# Patient Record
Sex: Male | Born: 1960 | Race: Black or African American | Hispanic: No | Marital: Married | Smoking: Current every day smoker
Health system: Southern US, Community
[De-identification: ages and names within clinical notes are randomized; demographics above are authoritative.]

## PROBLEM LIST (undated history)

## (undated) DIAGNOSIS — I480 Paroxysmal atrial fibrillation: Secondary | ICD-10-CM

## (undated) DIAGNOSIS — Z8489 Family history of other specified conditions: Secondary | ICD-10-CM

## (undated) DIAGNOSIS — E119 Type 2 diabetes mellitus without complications: Secondary | ICD-10-CM

## (undated) DIAGNOSIS — I10 Essential (primary) hypertension: Secondary | ICD-10-CM

## (undated) DIAGNOSIS — K859 Acute pancreatitis without necrosis or infection, unspecified: Secondary | ICD-10-CM

## (undated) DIAGNOSIS — R112 Nausea with vomiting, unspecified: Secondary | ICD-10-CM

## (undated) HISTORY — PX: NO PAST SURGERIES: SHX2092

---

## 1998-05-04 ENCOUNTER — Emergency Department (HOSPITAL_COMMUNITY): Admission: EM | Admit: 1998-05-04 | Discharge: 1998-05-04 | Payer: Self-pay | Admitting: Emergency Medicine

## 1998-10-02 ENCOUNTER — Emergency Department (HOSPITAL_COMMUNITY): Admission: EM | Admit: 1998-10-02 | Discharge: 1998-10-02 | Payer: Self-pay | Admitting: Emergency Medicine

## 1998-11-05 ENCOUNTER — Encounter: Admission: RE | Admit: 1998-11-05 | Discharge: 1999-02-03 | Payer: Self-pay | Admitting: Unknown Physician Specialty

## 2008-06-27 ENCOUNTER — Emergency Department (HOSPITAL_COMMUNITY): Admission: EM | Admit: 2008-06-27 | Discharge: 2008-06-27 | Payer: Self-pay | Admitting: Emergency Medicine

## 2008-07-05 ENCOUNTER — Emergency Department (HOSPITAL_COMMUNITY): Admission: EM | Admit: 2008-07-05 | Discharge: 2008-07-05 | Payer: Self-pay | Admitting: Emergency Medicine

## 2008-11-10 ENCOUNTER — Emergency Department (HOSPITAL_COMMUNITY): Admission: EM | Admit: 2008-11-10 | Discharge: 2008-11-10 | Payer: Self-pay | Admitting: Emergency Medicine

## 2008-11-28 ENCOUNTER — Emergency Department (HOSPITAL_COMMUNITY): Admission: EM | Admit: 2008-11-28 | Discharge: 2008-11-28 | Payer: Self-pay | Admitting: Emergency Medicine

## 2008-11-30 ENCOUNTER — Emergency Department (HOSPITAL_COMMUNITY): Admission: EM | Admit: 2008-11-30 | Discharge: 2008-11-30 | Payer: Self-pay | Admitting: Emergency Medicine

## 2009-09-03 ENCOUNTER — Emergency Department (HOSPITAL_COMMUNITY): Admission: EM | Admit: 2009-09-03 | Discharge: 2009-09-03 | Payer: Self-pay | Admitting: Emergency Medicine

## 2010-08-24 LAB — GLUCOSE, CAPILLARY

## 2010-09-05 ENCOUNTER — Emergency Department (HOSPITAL_COMMUNITY): Payer: 59

## 2010-09-05 ENCOUNTER — Inpatient Hospital Stay (HOSPITAL_COMMUNITY)
Admission: EM | Admit: 2010-09-05 | Discharge: 2010-09-07 | DRG: 310 | Disposition: A | Discharge status: Home / Self Care | Payer: 59 | Attending: Internal Medicine | Admitting: Internal Medicine

## 2010-09-05 DIAGNOSIS — R109 Unspecified abdominal pain: Secondary | ICD-10-CM | POA: Diagnosis present

## 2010-09-05 DIAGNOSIS — K219 Gastro-esophageal reflux disease without esophagitis: Secondary | ICD-10-CM | POA: Diagnosis present

## 2010-09-05 DIAGNOSIS — Z7982 Long term (current) use of aspirin: Secondary | ICD-10-CM

## 2010-09-05 DIAGNOSIS — I10 Essential (primary) hypertension: Secondary | ICD-10-CM | POA: Diagnosis present

## 2010-09-05 DIAGNOSIS — E119 Type 2 diabetes mellitus without complications: Secondary | ICD-10-CM | POA: Diagnosis present

## 2010-09-05 DIAGNOSIS — F17200 Nicotine dependence, unspecified, uncomplicated: Secondary | ICD-10-CM | POA: Diagnosis present

## 2010-09-05 DIAGNOSIS — I4891 Unspecified atrial fibrillation: Principal | ICD-10-CM | POA: Diagnosis present

## 2010-09-05 LAB — GLUCOSE, CAPILLARY
Glucose-Capillary: 326 mg/dL — ABNORMAL HIGH (ref 70–99)
Glucose-Capillary: 214 mg/dL — ABNORMAL HIGH (ref 70–99)

## 2010-09-05 LAB — COMPREHENSIVE METABOLIC PANEL
AST: 28 U/L (ref 0–37)
Albumin: 4.1 g/dL (ref 3.5–5.2)
Calcium: 9.5 mg/dL (ref 8.4–10.5)
Chloride: 97 mEq/L (ref 96–112)
Creatinine, Ser: 1.15 mg/dL (ref 0.4–1.5)
GFR calc Af Amer: >60 mL/min (ref >60)
Total Bilirubin: 0.6 mg/dL (ref 0.3–1.2)
Total Protein: 7.2 g/dL (ref 6.0–8.3)

## 2010-09-05 LAB — CBC
Hemoglobin: 13.4 g/dL (ref 13.0–17.0)
MCH: 24.3 pg — ABNORMAL LOW (ref 26.0–34.0)
RBC: 5.52 MIL/uL (ref 4.22–5.81)
WBC: 7.2 10*3/uL (ref 4.0–10.5)

## 2010-09-05 LAB — URINALYSIS, ROUTINE W REFLEX MICROSCOPIC
Glucose, UA: >1000 mg/dL — AB
Hgb urine dipstick: NEGATIVE
Leukocytes, UA: NEGATIVE
Specific Gravity, Urine: 1.029 (ref 1.005–1.030)
pH: 5.5 (ref 5.0–8.0)

## 2010-09-05 LAB — DIFFERENTIAL
Basophils Relative: 0 % (ref 0–1)
Lymphs Abs: 1.3 10*3/uL (ref 0.7–4.0)
Monocytes Relative: 4 % (ref 3–12)
Neutro Abs: 5.6 10*3/uL (ref 1.7–7.7)
Neutrophils Relative %: 77 % (ref 43–77)

## 2010-09-05 LAB — CARDIAC PANEL(CRET KIN+CKTOT+MB+TROPI)
Total CK: 357 U/L — ABNORMAL HIGH (ref 7–232)
Troponin I: 0.01 ng/mL (ref 0.00–0.06)

## 2010-09-05 LAB — POCT CARDIAC MARKERS
CKMB, poc: 1.1 ng/mL (ref 1.0–8.0)
Troponin i, poc: <0.05 ng/mL (ref 0.00–0.09)

## 2010-09-05 LAB — URINE MICROSCOPIC-ADD ON

## 2010-09-05 LAB — CK TOTAL AND CKMB (NOT AT ARMC): Total CK: 362 U/L — ABNORMAL HIGH (ref 7–232)

## 2010-09-05 NOTE — H&P (Addendum)
[name], [name]             ACCOUNT NO.:  [id number]  MEDICAL RECORD NO.:  [id number]           PATIENT TYPE:  E  LOCATION:  [facility]                         FACILITY:  [facility]  PHYSICIAN:  Andreas Blower, MD       DATE OF BIRTH:  02/08/1961  DATE OF ADMISSION:  09/05/2010 DATE OF DISCHARGE:                             HISTORY & PHYSICAL   PRIMARY CARE PROVIDER:  Margaretmary Bayley, MD  The patient is being admitted to triad hospitalist with [facility], Team #2.  CHIEF COMPLAINT:  Abdominal pain.  HISTORY OF PRESENT ILLNESS:  [name] [name] is very pleasant 50 year old male with a history of diabetes who presents to the [facility] ED with a chief complaint of burning epigastric discomfort for the past week. Information is obtained from the patient and his wife who is at the bedside.  He reports that he has also experienced intermittent nausea and vomiting without bloody emesis or coffee-ground emesis.  He denies diarrhea, shortness of breath, or chest pain.  He states that this has been going on for 7 days and for the last 3 days, he was doing fairly well until this morning, had awakened him about 2 a.m.  He did report vomiting this morning.  He also reports and gastric reflux during this time.  He says that he took Zantac over-the-counter and got some mild relief.  He indicates that he had a sensation of "food being stuck in the top of my stomach and I needed to burp."  He also indicates that he got some relief after vomiting.  He denies any radiation of the pain to his arm, shoulder, or jaw.  He does endorse some chills.  During his workup in the emergency room, he went into AFib.  Symptoms came on gradually, have persistent intermittently are characterized as mild to moderate.  We are asked to admit for further evaluation and treatment.  ALLERGIES:  No known drug allergies.  PAST MEDICAL HISTORY:  Diabetes type 2.  PAST SURGICAL HISTORY:  None.  FAMILY MEDICAL HISTORY:  Positive for  diabetes with his grandmother and his mother.  Father deceased and he is unaware of his medical history.  MEDICATIONS: 1. Glucovance 550 p.o. b.i.d. 2. Lisinopril, dose unknown. 3. Triginta dose unknown, to be verified by pharmacy.  REVIEW OF SYSTEMS:  GENERAL:  Positive chills.  Negative fever. Unintentional weight loss.  ENT:  Negative for ear pain, nasal congestion, sore throat.  CARDIOVASCULAR:  Negative for chest pain, palpitation, and  lower extremity edema.  RESPIRATORY:  Negative shortness of breath or cough.  MUSCULOSKELETAL:  Negative for joint pain and muscle weakness.  NEUROLOGIC:  Negative visual disturbances, numbness, tingling of extremities, headache.  GASTROINTESTINAL:  See HPI.  GENITOURINARY:  Negative for dysuria, hematuria, frequency, or urgency.  PSYCHIATRIC:  Negative for depression and anxiety. HEMATOLOGIC:  Negative for any unusual bruising or bleeding.  LABORATORY DATA: 1. Sodium 131, potassium 4.6, chloride 97, CO2 of 23, BUN 18,     creatinine 1.15, and glucose 342.  WBC 7.2, hemoglobin 13.4,     hematocrit 40.1, platelets are 209, MCV 72.6.  Lipase 67, CK-MB  1.1, troponin 1 less than 0.05, myoglobin 66.9. 2. Urinalysis 15 ketones greater than 1000 urine, rare bacteria, 0-2     wbcs.  PHYSICAL EXAMINATION:  VITAL SIGNS:  T 97.5, BP 121/79, heart rate 90, respirations 19, and sats 97% on room air. GENERAL:  Awake, alert, well-nourished, well-hydrated no acute distress. HEAD:  Normocephalic, atraumatic.  Pupils are equal round and reactive to light.  EOMI.  Mucous membranes of mouth are moist and pink.  No obvious lesion or exudate in his nose or ears. NECK:  Supple.  No JVD.  Full range of motion.  No lymphadenopathy. CARDIOVASCULAR:  Regular rate and rhythm.  No murmur, gallop, or rub. No lower extremity edema. ABDOMEN:  Flat, soft.  Positive bowel sounds, but sluggish, nontender to palpation. NEUROLOGIC:  Alert and oriented x3.  Speech clear.   Facial symmetry. Cranial nerves II through XII grossly intact. MUSCULOSKELETAL:  Moves all extremities.  No joint swelling/erythema. EXTREMITIES:  Without clubbing or cyanosis.  ASSESSMENT/PLAN: 1. Atrial fibrillation with rapid ventricular rate.  Now controlled     and back in sinus rythm without intervention.  We will admit to     tele floor on low-dose beta-blocker.  We will get a 2-D echo.     His CHADS-2 score if no hypertension is one, for which aspirin     will be started.  The patient denies history of hypertension.     States he is on an ACE for nephroprotection.  If after his     admission he continues with intermittent atrial fibrillation,     we will consider Coumadin.  If he stays in sinus rhythm with a     beta-blocker only, we may consider discharge on aspirin with     further management by his PCP. 2. Gastric pain, uncertain if mild pancreatitis as his lipase is     elevated.  Also endorses some EtOH use.  We will place on a clear     liquid, advance as tolerated.  We will get abdominal x-ray.  If no     relief, consider right upper quadrant ultrasound. 3. Diabetes.  Continue his home meds once there are known. Sliding     scale glycemic control. 4. Hypertension.  May need to decrease his antihypertensives given     that beta-blocker was started during this hospitalization.  We will     monitor. 5. Tobacco use.  We will provide nicotine patch as needed. 6. EtOH use.  Some question about the degree of use.  CIWA protocol as     there is a doubt that he may be consuming more than he reported. 7. DVT prophylaxis.  We will use Lovenox. 8. Code status.  The patient is full code.  This assessment and plan was discussed with Dr. Betti Cruz.   Gwenyth Bender, NP ______________________________ Andreas Blower, MD   KMB/MEDQ  D:  09/05/2010  T:  09/05/2010  Job:  [id number]  cc:   Margaretmary Bayley, M.D.  Electronically Signed by Andreas Blower  on 09/05/2010 04:06:20 PM Electronically  Signed by Toya Smothers  on 09/12/2010 09:21:13 AM

## 2010-09-06 LAB — CBC
HCT: 36.4 % — ABNORMAL LOW (ref 39.0–52.0)
MCH: 23.6 pg — ABNORMAL LOW (ref 26.0–34.0)
MCV: 72.9 fL — ABNORMAL LOW (ref 78.0–100.0)
Platelets: 200 10*3/uL (ref 150–400)
RDW: 14.5 % (ref 11.5–15.5)
WBC: 6.9 10*3/uL (ref 4.0–10.5)

## 2010-09-06 LAB — GLUCOSE, CAPILLARY
Glucose-Capillary: 280 mg/dL — ABNORMAL HIGH (ref 70–99)
Glucose-Capillary: 208 mg/dL — ABNORMAL HIGH (ref 70–99)
Glucose-Capillary: 256 mg/dL — ABNORMAL HIGH (ref 70–99)

## 2010-09-06 LAB — BASIC METABOLIC PANEL
BUN: 16 mg/dL (ref 6–23)
Chloride: 99 mEq/L (ref 96–112)
Creatinine, Ser: 1.17 mg/dL (ref 0.4–1.5)
Glucose, Bld: 297 mg/dL — ABNORMAL HIGH (ref 70–99)
Potassium: 4.8 mEq/L (ref 3.5–5.1)

## 2010-09-06 LAB — HEMOGLOBIN A1C
Hgb A1c MFr Bld: 10.3 % — ABNORMAL HIGH (ref <5.7)
Mean Plasma Glucose: 249 mg/dL — ABNORMAL HIGH (ref <117)

## 2010-09-06 LAB — PROTIME-INR: INR: 0.98 (ref 0.00–1.49)

## 2010-09-06 LAB — MAGNESIUM: Magnesium: 1.7 mg/dL (ref 1.5–2.5)

## 2010-09-06 LAB — LIPID PANEL
Cholesterol: 159 mg/dL (ref 0–200)
LDL Cholesterol: 92 mg/dL (ref 0–99)

## 2010-09-06 LAB — CARDIAC PANEL(CRET KIN+CKTOT+MB+TROPI)
Relative Index: 0.5 (ref 0.0–2.5)
Troponin I: 0.01 ng/mL (ref 0.00–0.06)

## 2010-09-07 DIAGNOSIS — I4891 Unspecified atrial fibrillation: Secondary | ICD-10-CM

## 2010-09-07 LAB — CBC
MCH: 23.4 pg — ABNORMAL LOW (ref 26.0–34.0)
MCHC: 32.1 g/dL (ref 30.0–36.0)
MCV: 72.8 fL — ABNORMAL LOW (ref 78.0–100.0)
Platelets: 220 10*3/uL (ref 150–400)
RDW: 14.6 % (ref 11.5–15.5)

## 2010-09-07 LAB — BASIC METABOLIC PANEL
BUN: 15 mg/dL (ref 6–23)
CO2: 30 mEq/L (ref 19–32)
Calcium: 10 mg/dL (ref 8.4–10.5)
GFR calc non Af Amer: >60 mL/min (ref >60)
Glucose, Bld: 312 mg/dL — ABNORMAL HIGH (ref 70–99)

## 2010-09-07 LAB — GLUCOSE, CAPILLARY: Glucose-Capillary: 346 mg/dL — ABNORMAL HIGH (ref 70–99)

## 2010-09-08 NOTE — Discharge Summary (Signed)
NAMEJAMERION, [name] [name]             ACCOUNT NO.:  [id number]  MEDICAL RECORD NO.:  [id number]           PATIENT TYPE:  LOCATION:                                 FACILITY:  PHYSICIAN:  Andreas Blower, MD       DATE OF BIRTH:  1961/01/05  DATE OF ADMISSION: DATE OF DISCHARGE:                              DISCHARGE SUMMARY   PRIMARY CARE PHYSICIAN:  Margaretmary Bayley, MD.  DISCHARGE DIAGNOSES: 1. Atrial fibrillation with rapid ventricular response, resolved.     Currently in sinus rhythm at the time of discharge. 2. Hypertension. 3. Gastroesophageal reflux disease/abdominal pain, resolved. 4. Type 2 diabetes. 5. Tobacco use. 6. Alcohol use.  DISCHARGE MEDICATIONS: 1. Aspirin 325 mg p.o. daily. 2. Folic acid 1 mg p.o. daily. 3. Lisinopril 5 mg p.o. daily. 4. Metoprolol 25 mg p.o. twice daily. 5. Multivitamin 1 tablet p.o. daily. 6. Nicotine patch 14 mg per 24 hours daily as needed. 7. Omeprazole 20 mg p.o. daily. 8. Thiamine 100 mg p.o. daily. 9. Glyburide/metformin 5/500 1 tablet p.o. twice daily. 10.Linagliptin 5 mg p.o. daily. 11.Ranitidine 1 tablet p.o. twice daily as needed.  BRIEF ADMITTING HISTORY AND PHYSICAL:  [name] [name] is a 50 year old African American male, who presented to the ED with complaints of abdominal pain.  While he was in the ER, on monitor, he was found to have AFib with RVR, which resolved without any intervention.  RADIOLOGY/IMAGING:  Abdominal series showed benign appearing abdomen and chest.  CBC shows a white count of 5.4, hemoglobin 13.5, hematocrit 42.1, platelet count 220.  Electrolytes normal with a creatinine of 1.08. Liver function tests normal.  Hemoglobin A1c is 10.3, lipase is 67. Troponins are negative x3.  LDL is 92.  UA was negative for nitrates and leukocytes.  The patient had 2D-echocardiogram, which showed left ventricle, showed mild global hypokinesis, ejection fraction was 45%-50%, the cavity size was normal, wall thickness was  increased in the pattern of mild LVH.  HOSPITAL COURSE BY PROBLEM: 1. AFib with RVR.  The patient initially was found to have heart rate     of 120s while he was in the ER; however, the patient converted to     normal sinus rhythm without any intervention.  The patient was     started on low-dose metoprolol 25 mg p.o. twice daily.  He did not     have any further episodes of AFib with RVR during the course of the     hospital stay.  The patient's CHADS2 score is 2.  Given that the     patient did not have any further episodes of AFib with RVR, the     patient will be discharged home on aspirin 325 mg p.o. daily.  A 2-     D echocardiogram was obtained with results as indicated above.  The     patient had his troponins trended and was ruled out for acute     coronary syndrome.  The patient was instructed to have Dr. Chestine Spore     referred the patient to cardiologist as an outpatient given that     the patient's  ejection fraction was decreased mildly at 45%-50% and     there was mild global hypokinesis. 2. Hypertension.  Blood pressure stable during the course of hospital     stay.  He will be discharged home on metoprolol and lisinopril. 3. GERD/abdominal pain, resolved.  He was discharged home on     omeprazole 20 mg p.o. daily.  Initially, lipase was mildly     elevated, however, the patient did not have any difficulty eating     during the course of hospital stay. 4. Type 2 diabetes.  Hemoglobin A1c was 10.3.  I had a serious     discussion with the patient about the need for insulin.  He stated     that he will follow with Dr. Chestine Spore in about 3 months, at which     time, Dr. Chestine Spore can check another hemoglobin A1c.  If still greater     than 10, would consider starting him on insulin, and the patient     was agreeable for insulin to be started at that time. 5. Tobacco use, encouraged smoking cessation.  Nicotine patches was     prescribed at the time of discharge. 6. Alcohol abuse.   Again, encouraged to stop drinking.  Thiamine     and folate were prescribed at the time of discharge.  Initially on     admission, the patient was started on CIWA protocol, however, he     did not require Ativan during the course of hospital stay.  DISPOSITION AND FOLLOWUP:  The patient is to follow up with his primary care physician Dr. Chestine Spore in 1 week.  Dr. Chestine Spore is to refer the patient to a cardiologist as an outpatient.  Dr. Chestine Spore do also help him to continue to manage his diabetes as an outpatient.  TIME SPENT ON DISCHARGE:  Talking to the patient, talking to the patient's wife, and counseling the patient is 35 minutes.   Andreas Blower, MD   SR/MEDQ  D:  09/07/2010  T:  09/08/2010  Job:  [id number]  cc:   Margaretmary Bayley, M.D.  Electronically Signed by Wardell Heath Maurice Ramseur  on 09/08/2010 09:34:50 PM

## 2010-09-19 LAB — GLUCOSE, CAPILLARY: Glucose-Capillary: 314 mg/dL — ABNORMAL HIGH (ref 70–99)

## 2011-01-30 ENCOUNTER — Emergency Department (HOSPITAL_COMMUNITY)
Admission: EM | Admit: 2011-01-30 | Discharge: 2011-01-30 | Discharge status: Against Medical Advice | Payer: 59 | Attending: Emergency Medicine | Admitting: Emergency Medicine

## 2011-01-30 DIAGNOSIS — K089 Disorder of teeth and supporting structures, unspecified: Secondary | ICD-10-CM | POA: Insufficient documentation

## 2013-05-24 ENCOUNTER — Emergency Department (HOSPITAL_COMMUNITY)
Admission: EM | Admit: 2013-05-24 | Discharge: 2013-05-24 | Disposition: A | Discharge status: Home / Self Care | Payer: 59 | Attending: Emergency Medicine | Admitting: Emergency Medicine

## 2013-05-24 ENCOUNTER — Encounter (HOSPITAL_COMMUNITY): Payer: Self-pay | Admitting: Emergency Medicine

## 2013-05-24 DIAGNOSIS — S46909A Unspecified injury of unspecified muscle, fascia and tendon at shoulder and upper arm level, unspecified arm, initial encounter: Secondary | ICD-10-CM | POA: Insufficient documentation

## 2013-05-24 DIAGNOSIS — Y9389 Activity, other specified: Secondary | ICD-10-CM | POA: Insufficient documentation

## 2013-05-24 DIAGNOSIS — E119 Type 2 diabetes mellitus without complications: Secondary | ICD-10-CM | POA: Insufficient documentation

## 2013-05-24 DIAGNOSIS — S4980XA Other specified injuries of shoulder and upper arm, unspecified arm, initial encounter: Secondary | ICD-10-CM | POA: Insufficient documentation

## 2013-05-24 DIAGNOSIS — Y92410 Unspecified street and highway as the place of occurrence of the external cause: Secondary | ICD-10-CM | POA: Insufficient documentation

## 2013-05-24 DIAGNOSIS — IMO0002 Reserved for concepts with insufficient information to code with codable children: Secondary | ICD-10-CM | POA: Insufficient documentation

## 2013-05-24 DIAGNOSIS — F17200 Nicotine dependence, unspecified, uncomplicated: Secondary | ICD-10-CM | POA: Insufficient documentation

## 2013-05-24 DIAGNOSIS — M25511 Pain in right shoulder: Secondary | ICD-10-CM

## 2013-05-24 MED ORDER — MELOXICAM 7.5 MG PO TABS: 7.5000 mg | ORAL_TABLET | Freq: Every day | ORAL | Status: DC

## 2013-05-24 MED ORDER — METHOCARBAMOL 500 MG PO TABS: 500.0000 mg | ORAL_TABLET | Freq: Two times a day (BID) | ORAL | Status: DC
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
## 2013-05-28 NOTE — ED Provider Notes (Signed)
Medical screening examination/treatment/procedure(s) were performed by non-physician practitioner and as supervising physician I was immediately available for consultation/collaboration.  EKG Interpretation   None         Shona Pardo W. Alianis Trimmer, MD 05/28/13 1528 

## 2013-10-29 ENCOUNTER — Encounter (HOSPITAL_COMMUNITY): Payer: Self-pay | Admitting: Emergency Medicine

## 2013-10-29 ENCOUNTER — Emergency Department (HOSPITAL_COMMUNITY)
Admission: EM | Admit: 2013-10-29 | Discharge: 2013-10-30 | Disposition: A | Discharge status: Home / Self Care | Payer: 59 | Attending: Emergency Medicine | Admitting: Emergency Medicine

## 2013-10-29 DIAGNOSIS — S298XXA Other specified injuries of thorax, initial encounter: Secondary | ICD-10-CM | POA: Insufficient documentation

## 2013-10-29 DIAGNOSIS — F17200 Nicotine dependence, unspecified, uncomplicated: Secondary | ICD-10-CM | POA: Insufficient documentation

## 2013-10-29 DIAGNOSIS — Z79899 Other long term (current) drug therapy: Secondary | ICD-10-CM | POA: Insufficient documentation

## 2013-10-29 DIAGNOSIS — Y9301 Activity, walking, marching and hiking: Secondary | ICD-10-CM | POA: Insufficient documentation

## 2013-10-29 DIAGNOSIS — R55 Syncope and collapse: Secondary | ICD-10-CM | POA: Insufficient documentation

## 2013-10-29 DIAGNOSIS — S0180XA Unspecified open wound of other part of head, initial encounter: Secondary | ICD-10-CM | POA: Insufficient documentation

## 2013-10-29 DIAGNOSIS — Y92009 Unspecified place in unspecified non-institutional (private) residence as the place of occurrence of the external cause: Secondary | ICD-10-CM | POA: Insufficient documentation

## 2013-10-29 DIAGNOSIS — S0181XA Laceration without foreign body of other part of head, initial encounter: Secondary | ICD-10-CM

## 2013-10-29 DIAGNOSIS — E119 Type 2 diabetes mellitus without complications: Secondary | ICD-10-CM | POA: Insufficient documentation

## 2013-10-29 DIAGNOSIS — W1809XA Striking against other object with subsequent fall, initial encounter: Secondary | ICD-10-CM | POA: Insufficient documentation

## 2013-10-29 NOTE — ED Notes (Signed)
Brief confusion on what room to take pt to.  Pt was never placed in room 13.
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
## 2013-10-30 ENCOUNTER — Emergency Department (HOSPITAL_COMMUNITY): Payer: 59

## 2013-10-30 LAB — I-STAT CHEM 8, ED
BUN: 25 mg/dL — ABNORMAL HIGH (ref 6–23)
CALCIUM ION: 1.15 mmol/L (ref 1.12–1.23)
CHLORIDE: 94 meq/L — AB (ref 96–112)
Creatinine, Ser: 1.7 mg/dL — ABNORMAL HIGH (ref 0.50–1.35)
Glucose, Bld: 266 mg/dL — ABNORMAL HIGH (ref 70–99)
HEMATOCRIT: 39 % (ref 39.0–52.0)
Hemoglobin: 13.3 g/dL (ref 13.0–17.0)
Potassium: 3.9 mEq/L (ref 3.7–5.3)
Sodium: 139 mEq/L (ref 137–147)
TCO2: 25 mmol/L (ref 0–100)

## 2013-10-30 LAB — CBC WITH DIFFERENTIAL/PLATELET
BASOS ABS: 0 10*3/uL (ref 0.0–0.1)
BASOS PCT: 1 % (ref 0–1)
EOS PCT: 1 % (ref 0–5)
Eosinophils Absolute: 0.1 10*3/uL (ref 0.0–0.7)
HEMATOCRIT: 35.1 % — AB (ref 39.0–52.0)
Hemoglobin: 11.1 g/dL — ABNORMAL LOW (ref 13.0–17.0)
Lymphocytes Relative: 50 % — ABNORMAL HIGH (ref 12–46)
Lymphs Abs: 3.3 10*3/uL (ref 0.7–4.0)
MCH: 24.1 pg — ABNORMAL LOW (ref 26.0–34.0)
MCHC: 31.6 g/dL (ref 30.0–36.0)
MCV: 76.3 fL — ABNORMAL LOW (ref 78.0–100.0)
MONO ABS: 0.4 10*3/uL (ref 0.1–1.0)
Monocytes Relative: 6 % (ref 3–12)
NEUTROS ABS: 2.7 10*3/uL (ref 1.7–7.7)
Neutrophils Relative %: 42 % — ABNORMAL LOW (ref 43–77)
Platelets: 209 10*3/uL (ref 150–400)
RBC: 4.6 MIL/uL (ref 4.22–5.81)
RDW: 15 % (ref 11.5–15.5)
WBC: 6.4 10*3/uL (ref 4.0–10.5)

## 2013-10-30 LAB — I-STAT TROPONIN, ED: TROPONIN I, POC: 0 ng/mL (ref 0.00–0.08)

## 2013-10-30 LAB — ETHANOL: ALCOHOL ETHYL (B): 51 mg/dL — AB (ref 0–11)

## 2013-10-30 MED ORDER — TETANUS-DIPHTH-ACELL PERTUSSIS 5-2.5-18.5 LF-MCG/0.5 IM SUSP
0.5000 mL | Freq: Once | INTRAMUSCULAR | Status: DC
  Filled 2013-10-30: qty 0.5

## 2013-10-30 MED ORDER — SODIUM CHLORIDE 0.9 % IV BOLUS (SEPSIS)
1000.0000 mL | Freq: Once | INTRAVENOUS | Status: AC
  Administered 2013-10-30: 1000 mL via INTRAVENOUS

## 2013-10-30 NOTE — ED Provider Notes (Signed)
CSN: [id number]     Arrival date & time 10/29/13  2311 History   First MD Initiated Contact with Patient 10/30/13 0101     Chief Complaint  Patient presents with  . Syncope      (Consider location/radiation/quality/duration/timing/severity/associated sxs/prior Treatment) HPI  Past Medical History  Diagnosis Date  . Diabetes mellitus without complication    History reviewed. No pertinent past surgical history. No family history on file. History  Substance Use Topics  . Smoking status: Current Every Day Smoker    Types: Cigarettes  . Smokeless tobacco: Not on file  . Alcohol Use: Yes     Comment: occ    Review of Systems    Allergies  Bee venom  Home Medications   Prior to Admission medications   Medication Sig Start Date End Date Taking? Authorizing Provider  glyBURIDE-metformin (GLUCOVANCE) 5-500 MG per tablet Take 1 tablet by mouth 2 (two) times daily with a meal.   Yes Historical Provider, MD  linagliptin (TRADJENTA) 5 MG TABS tablet Take 5 mg by mouth daily.   Yes Historical Provider, MD  lisinopril (PRINIVIL,ZESTRIL) 5 MG tablet Take 5 mg by mouth daily.   Yes Historical Provider, MD  metoprolol tartrate (LOPRESSOR) 25 MG tablet Take 25 mg by mouth 2 (two) times daily.   Yes Historical Provider, MD   BP 136/72  Pulse 88  Temp(Src) 97.6 F (36.4 C) (Oral)  Resp 19  SpO2 100% Physical Exam  ED Course  LACERATION REPAIR Date/Time: 10/30/2013 3:10 AM Performed by: Arman FilterSCHULZ, Delois Tolbert K Authorized by: Arman FilterSCHULZ, Johnpaul Gillentine K Consent: Verbal consent obtained. written consent not obtained. Risks and benefits: risks, benefits and alternatives were discussed Consent given by: patient Patient understanding: patient states understanding of the procedure being performed Patient identity confirmed: verbally with patient Time out: Immediately prior to procedure a "time out" was called to verify the correct patient, procedure, equipment, support staff and site/side marked as  required. Body area: head/neck Location details: forehead Laceration length: 3 cm Foreign bodies: no foreign bodies Tendon involvement: none Nerve involvement: none Vascular damage: no Anesthesia: local infiltration Local anesthetic: lidocaine 1% with epinephrine Anesthetic total: 2 ml Patient sedated: no Preparation: Patient was prepped and draped in the usual sterile fashion. Irrigation solution: saline Amount of cleaning: standard Debridement: none Degree of undermining: none Skin closure: 6-0 Prolene Subcutaneous closure: 5-0 Vicryl Number of sutures: 10 Approximation: close Approximation difficulty: simple Dressing: antibiotic ointment Patient tolerance: Patient tolerated the procedure well with no immediate complications.   (including critical care time) Labs Review Labs Reviewed  ETHANOL - Abnormal; Notable for the following:    Alcohol, Ethyl (B) 51 (*)    All other components within normal limits  CBC WITH DIFFERENTIAL - Abnormal; Notable for the following:    Hemoglobin 11.1 (*)    HCT 35.1 (*)    MCV 76.3 (*)    MCH 24.1 (*)    Neutrophils Relative % 42 (*)    Lymphocytes Relative 50 (*)    All other components within normal limits  I-STAT CHEM 8, ED - Abnormal; Notable for the following:    Chloride 94 (*)    BUN 25 (*)    Creatinine, Ser 1.70 (*)    Glucose, Bld 266 (*)    All other components within normal limits  I-STAT TROPOININ, ED    Imaging Review Ct Head Wo Contrast  10/30/2013   CLINICAL DATA:  Status post fall; hit head against pavement. Loss of consciousness. Laceration at the  left forehead. Hypotension.  EXAM: CT HEAD WITHOUT CONTRAST  TECHNIQUE: Contiguous axial images were obtained from the base of the skull through the vertex without intravenous contrast.  COMPARISON:  None.  FINDINGS: There is no evidence of acute infarction, mass lesion, or intra- or extra-axial hemorrhage on CT.  The posterior fossa, including the cerebellum, brainstem  and fourth ventricle, is within normal limits. The third and lateral ventricles, and basal ganglia are unremarkable in appearance. The cerebral hemispheres are symmetric in appearance, with normal gray-white differentiation. No mass effect or midline shift is seen.  There is no evidence of fracture; visualized osseous structures are unremarkable in appearance. The orbits are within normal limits. Mild mucosal thickening is noted within the maxillary sinuses, left side of the sphenoid sinus and frontal sinuses; the remaining paranasal sinuses and mastoid air cells are well-aerated. A soft tissue laceration is noted overlying the left frontal calvarium, with associated soft tissue swelling on the left side.  IMPRESSION: 1. No evidence of traumatic intracranial injury or fracture. 2. Soft tissue laceration overlying the left frontal calvarium, with associated more diffuse soft tissue swelling. 3. Mild mucosal thickening within the maxillary sinuses, left side of the sphenoid sinus and frontal sinuses.   Electronically Signed   By: Roanna Raider M.D.   On: 10/30/2013 02:15     EKG Interpretation   Date/Time:  Thursday Oct 30 2013 01:29:21 EDT Ventricular Rate:  78 PR Interval:  193 QRS Duration: 77 QT Interval:  361 QTC Calculation: 411 R Axis:   51 Text Interpretation:  Sinus rhythm ST elev, probable normal early repol  pattern Previously atrial fibrillation Confirmed by Read Drivers  MD, Jonny Ruiz  [phone]) on 10/30/2013 2:20:27 AM      MDM   Final diagnoses:  Syncope  Laceration of forehead         Arman Filter, NP 10/30/13 [phone]

## 2013-10-30 NOTE — ED Provider Notes (Signed)
Medical screening examination/treatment/procedure(s) were conducted as a shared visit with non-physician practitioner(s) and myself.  I personally evaluated the patient during the encounter.   EKG Interpretation   Date/Time:  Thursday Oct 30 2013 01:29:21 EDT Ventricular Rate:  78 PR Interval:  193 QRS Duration: 77 QT Interval:  361 QTC Calculation: 411 R Axis:   51 Text Interpretation:  Sinus rhythm ST elev, probable normal early repol  pattern Previously atrial fibrillation Confirmed by Read Drivers  MD, Jonny Ruiz  [phone]) on 10/30/2013 2:20:27 AM        Hanley Seamen, MD 10/30/13 [phone]

## 2013-10-30 NOTE — ED Provider Notes (Signed)
CSN: [id number]     Arrival date & time 10/29/13  2311 History   First MD Initiated Contact with Patient 10/30/13 0101     Chief Complaint  Patient presents with  . Syncope      (Consider location/radiation/quality/duration/timing/severity/associated sxs/prior Treatment) HPI This is a 53 year old male who was walking home from a neighbors house just prior to arrival. He states he had "one and a half beers" (but admitted to his nurse that he had been drinking vodka earlier). As he was walking a neighbor observed him starting to stagger and then he passed out. He struck his forehead and has a laceration above the left eyebrow. He states he also has some mild tenderness to the left ribs. He denies neck pain. He was noted to be hypotensive on arrival but this is improved.  Past Medical History  Diagnosis Date  . Diabetes mellitus without complication    History reviewed. No pertinent past surgical history. No family history on file. History  Substance Use Topics  . Smoking status: Current Every Day Smoker    Types: Cigarettes  . Smokeless tobacco: Not on file  . Alcohol Use: Yes     Comment: occ    Review of Systems  All other systems reviewed and are negative.   Allergies  Bee venom  Home Medications   Prior to Admission medications   Medication Sig Start Date End Date Taking? Authorizing Provider  glyBURIDE-metformin (GLUCOVANCE) 5-500 MG per tablet Take 1 tablet by mouth 2 (two) times daily with a meal.   Yes Historical Provider, MD  linagliptin (TRADJENTA) 5 MG TABS tablet Take 5 mg by mouth daily.   Yes Historical Provider, MD  lisinopril (PRINIVIL,ZESTRIL) 5 MG tablet Take 5 mg by mouth daily.   Yes Historical Provider, MD  metoprolol tartrate (LOPRESSOR) 25 MG tablet Take 25 mg by mouth 2 (two) times daily.   Yes Historical Provider, MD   BP 128/65  Pulse 91  Temp(Src) 97.6 F (36.4 C) (Oral)  Resp 19  SpO2 100%  Physical Exam General: Well-developed,  well-nourished male in no acute distress; appearance consistent with age of record HENT: normocephalic; irregular laceration above left eyebrow; no hemotympanum Eyes: pupils equal, round and reactive to light; extraocular muscles intact Neck: supple; nontender Heart: regular rate and rhythm Lungs: clear to auscultation bilaterally Chest: Mild left chest wall tenderness without deformity or crepitus Abdomen: soft; nondistended; nontender Extremities: No deformity; full range of motion; pulses normal Neurologic: Awake, alert and oriented; motor function intact in all extremities and symmetric; no facial droop Skin: Warm and dry Psychiatric: Flat affect    ED Course  Procedures (including critical care time)   MDM   Nursing notes and vitals signs, including pulse oximetry, reviewed.  Summary of this visit's results, reviewed by myself:  Labs:  Results for orders placed during the hospital encounter of 10/29/13 (from the past 24 hour(s))  ETHANOL     Status: Abnormal   Collection Time    10/29/13 11:30 PM      Result Value Ref Range   Alcohol, Ethyl (B) 51 (*) 0 - 11 mg/dL  CBC WITH DIFFERENTIAL     Status: Abnormal   Collection Time    10/29/13 11:30 PM      Result Value Ref Range   WBC 6.4  4.0 - 10.5 K/uL   RBC 4.60  4.22 - 5.81 MIL/uL   Hemoglobin 11.1 (*) 13.0 - 17.0 g/dL   HCT 36.1 (*) 22.4 - 49.7 %  MCV 76.3 (*) 78.0 - 100.0 fL   MCH 24.1 (*) 26.0 - 34.0 pg   MCHC 31.6  30.0 - 36.0 g/dL   RDW 56.2  13.0 - 86.5 %   Platelets 209  150 - 400 K/uL   Neutrophils Relative % 42 (*) 43 - 77 %   Neutro Abs 2.7  1.7 - 7.7 K/uL   Lymphocytes Relative 50 (*) 12 - 46 %   Lymphs Abs 3.3  0.7 - 4.0 K/uL   Monocytes Relative 6  3 - 12 %   Monocytes Absolute 0.4  0.1 - 1.0 K/uL   Eosinophils Relative 1  0 - 5 %   Eosinophils Absolute 0.1  0.0 - 0.7 K/uL   Basophils Relative 1  0 - 1 %   Basophils Absolute 0.0  0.0 - 0.1 K/uL  I-STAT TROPOININ, ED     Status: None    Collection Time    10/30/13  1:32 AM      Result Value Ref Range   Troponin i, poc 0.00  0.00 - 0.08 ng/mL   Comment 3           I-STAT CHEM 8, ED     Status: Abnormal   Collection Time    10/30/13  1:34 AM      Result Value Ref Range   Sodium 139  137 - 147 mEq/L   Potassium 3.9  3.7 - 5.3 mEq/L   Chloride 94 (*) 96 - 112 mEq/L   BUN 25 (*) 6 - 23 mg/dL   Creatinine, Ser 7.84 (*) 0.50 - 1.35 mg/dL   Glucose, Bld 696 (*) 70 - 99 mg/dL   Calcium, Ion 2.95  2.84 - 1.23 mmol/L   TCO2 25  0 - 100 mmol/L   Hemoglobin 13.3  13.0 - 17.0 g/dL   HCT 13.2  44.0 - 10.2 %    Imaging Studies: Ct Head Wo Contrast  10/30/2013   CLINICAL DATA:  Status post fall; hit head against pavement. Loss of consciousness. Laceration at the left forehead. Hypotension.  EXAM: CT HEAD WITHOUT CONTRAST  TECHNIQUE: Contiguous axial images were obtained from the base of the skull through the vertex without intravenous contrast.  COMPARISON:  None.  FINDINGS: There is no evidence of acute infarction, mass lesion, or intra- or extra-axial hemorrhage on CT.  The posterior fossa, including the cerebellum, brainstem and fourth ventricle, is within normal limits. The third and lateral ventricles, and basal ganglia are unremarkable in appearance. The cerebral hemispheres are symmetric in appearance, with normal gray-white differentiation. No mass effect or midline shift is seen.  There is no evidence of fracture; visualized osseous structures are unremarkable in appearance. The orbits are within normal limits. Mild mucosal thickening is noted within the maxillary sinuses, left side of the sphenoid sinus and frontal sinuses; the remaining paranasal sinuses and mastoid air cells are well-aerated. A soft tissue laceration is noted overlying the left frontal calvarium, with associated soft tissue swelling on the left side.  IMPRESSION: 1. No evidence of traumatic intracranial injury or fracture. 2. Soft tissue laceration overlying the  left frontal calvarium, with associated more diffuse soft tissue swelling. 3. Mild mucosal thickening within the maxillary sinuses, left side of the sphenoid sinus and frontal sinuses.   Electronically Signed   By: Roanna Raider M.D.   On: 10/30/2013 02:15   3:03 AM Patient continues to be awake and alert in the emergency department. Given IV fluid bolus with improvement in his  blood pressure. Suspect syncopal episode may have been due to dehydration from consumption of alcohol. Wound repaired by Earley FavorGail Schulz, PA-C.     Hanley SeamenJohn L Tamura Lasky, MD 10/30/13 [phone]

## 2013-11-04 ENCOUNTER — Encounter (HOSPITAL_COMMUNITY): Payer: Self-pay | Admitting: Emergency Medicine

## 2013-11-04 ENCOUNTER — Emergency Department (HOSPITAL_COMMUNITY)
Admission: EM | Admit: 2013-11-04 | Discharge: 2013-11-04 | Disposition: A | Discharge status: Home / Self Care | Payer: 59 | Attending: Emergency Medicine | Admitting: Emergency Medicine

## 2013-11-04 DIAGNOSIS — F17200 Nicotine dependence, unspecified, uncomplicated: Secondary | ICD-10-CM | POA: Insufficient documentation

## 2013-11-04 DIAGNOSIS — E119 Type 2 diabetes mellitus without complications: Secondary | ICD-10-CM | POA: Insufficient documentation

## 2013-11-04 DIAGNOSIS — Z4802 Encounter for removal of sutures: Secondary | ICD-10-CM | POA: Insufficient documentation

## 2013-11-04 NOTE — ED Provider Notes (Signed)
CSN: [id number]     Arrival date & time 11/04/13  0850 History   First MD Initiated Contact with Patient 11/04/13 [phone]     Chief Complaint  Patient presents with  . Suture / Staple Removal     (Consider location/radiation/quality/duration/timing/severity/associated sxs/prior Treatment) The history is provided by the patient and medical records.   This is a 53 year old male with history of diabetes, presenting to the ED for suture removal.  Patient was seen in the ED 1 week ago after a syncopal episode where he hit his head. CT at that time was normal.  Patient had 10 sutures placed, 5 of which were dissolvable. He states when has been healing well. No fevers or chills. No drainage from wound.  No headaches, dizziness, lightheadedness, or recurrent syncope.  Pt has no new complaints.  Pt stated tetanus was UTD at prior visit and declined booster.  Past Medical History  Diagnosis Date  . Diabetes mellitus without complication    History reviewed. No pertinent past surgical history. No family history on file. History  Substance Use Topics  . Smoking status: Current Every Day Smoker    Types: Cigarettes  . Smokeless tobacco: Not on file  . Alcohol Use: Yes     Comment: occ    Review of Systems  Skin: Positive for wound.  All other systems reviewed and are negative.     Allergies  Bee venom  Home Medications   Prior to Admission medications   Medication Sig Start Date End Date Taking? Authorizing Provider  glyBURIDE-metformin (GLUCOVANCE) 5-500 MG per tablet Take 1 tablet by mouth 2 (two) times daily with a meal.    Historical Provider, MD  linagliptin (TRADJENTA) 5 MG TABS tablet Take 5 mg by mouth daily.    Historical Provider, MD  lisinopril (PRINIVIL,ZESTRIL) 5 MG tablet Take 5 mg by mouth daily.    Historical Provider, MD  metoprolol tartrate (LOPRESSOR) 25 MG tablet Take 25 mg by mouth 2 (two) times daily.    Historical Provider, MD   BP 145/87  Pulse 87  Temp(Src)  98.1 F (36.7 C) (Oral)  Resp 18  Ht 6\' 1"  (1.854 m)  Wt 172 lb (78.019 kg)  BMI 22.70 kg/m2  SpO2 100%  Physical Exam  Nursing note and vitals reviewed. Constitutional: He is oriented to person, place, and time. He appears well-developed and well-nourished. No distress.  HENT:  Head: Normocephalic and atraumatic.  Mouth/Throat: Oropharynx is clear and moist.  Well-healed laceration to left forehead, 5 prolene sutures and 5 vicryl sutures in place; no drainage or signs of superimposed infection  Eyes: Conjunctivae and EOM are normal. Pupils are equal, round, and reactive to light.  Neck: Normal range of motion.  Cardiovascular: Normal rate, regular rhythm and normal heart sounds.   Pulmonary/Chest: Effort normal and breath sounds normal.  Musculoskeletal: Normal range of motion.  Neurological: He is alert and oriented to person, place, and time.  Skin: Skin is warm and dry. He is not diaphoretic.  Psychiatric: He has a normal mood and affect.    ED Course  SUTURE REMOVAL Date/Time: 11/04/2013 9:15 AM Performed by: Garlon Hatchet Authorized by: Garlon Hatchet Consent: Verbal consent obtained. Risks and benefits: risks, benefits and alternatives were discussed Consent given by: patient Patient understanding: patient states understanding of the procedure being performed Patient identity confirmed: verbally with patient Body area: head/neck Location details: forehead Wound Appearance: clean Sutures Removed: 5 Post-removal: antibiotic ointment applied Facility: sutures placed in this  facility Patient tolerance: Patient tolerated the procedure well with no immediate complications.   (including critical care time) Labs Review Labs Reviewed - No data to display  Imaging Review No results found.   EKG Interpretation None      MDM   Final diagnoses:  Visit for suture removal   Tetanus up to date. Sutures removed without difficulty. Wound is clean without signs of  infection. Patient will follow with his primary care physician as needed.  Discussed plan with patient, he/she acknowledged understanding and agreed with plan of care.  Return precautions given for new or worsening symptoms.  Garlon HatchetLisa M Jonee Lamore, PA-C 11/04/13 [phone]

## 2013-11-04 NOTE — ED Notes (Signed)
Patient here for suture removal of L forehead.   Patient shows no s/s of infection.

## 2013-11-04 NOTE — ED Provider Notes (Signed)
Medical screening examination/treatment/procedure(s) were performed by non-physician practitioner and as supervising physician I was immediately available for consultation/collaboration.   EKG Interpretation None       Doug Sou, MD 11/04/13 [phone]

## 2013-11-04 NOTE — Discharge Instructions (Signed)
Keep face clean with soap and warm water.   May apply neosporin until scab clears. Once fully healed, may apply mederma to help reduce scarring.

## 2015-11-23 ENCOUNTER — Encounter (HOSPITAL_COMMUNITY): Payer: Self-pay | Admitting: Emergency Medicine

## 2015-11-23 ENCOUNTER — Other Ambulatory Visit: Payer: Self-pay

## 2015-11-23 ENCOUNTER — Observation Stay (HOSPITAL_COMMUNITY)
Admission: EM | Admit: 2015-11-23 | Discharge: 2015-11-26 | Disposition: A | Discharge status: Home / Self Care | Payer: 59 | Attending: Internal Medicine | Admitting: Internal Medicine

## 2015-11-23 DIAGNOSIS — I480 Paroxysmal atrial fibrillation: Secondary | ICD-10-CM | POA: Insufficient documentation

## 2015-11-23 DIAGNOSIS — D649 Anemia, unspecified: Secondary | ICD-10-CM | POA: Diagnosis present

## 2015-11-23 DIAGNOSIS — IMO0002 Reserved for concepts with insufficient information to code with codable children: Secondary | ICD-10-CM

## 2015-11-23 DIAGNOSIS — E871 Hypo-osmolality and hyponatremia: Secondary | ICD-10-CM

## 2015-11-23 DIAGNOSIS — K859 Acute pancreatitis without necrosis or infection, unspecified: Principal | ICD-10-CM

## 2015-11-23 DIAGNOSIS — E119 Type 2 diabetes mellitus without complications: Secondary | ICD-10-CM

## 2015-11-23 DIAGNOSIS — Z794 Long term (current) use of insulin: Secondary | ICD-10-CM | POA: Diagnosis not present

## 2015-11-23 DIAGNOSIS — F17210 Nicotine dependence, cigarettes, uncomplicated: Secondary | ICD-10-CM | POA: Insufficient documentation

## 2015-11-23 DIAGNOSIS — R1011 Right upper quadrant pain: Secondary | ICD-10-CM | POA: Diagnosis present

## 2015-11-23 DIAGNOSIS — I10 Essential (primary) hypertension: Secondary | ICD-10-CM | POA: Diagnosis not present

## 2015-11-23 DIAGNOSIS — D509 Iron deficiency anemia, unspecified: Secondary | ICD-10-CM | POA: Diagnosis not present

## 2015-11-23 DIAGNOSIS — K858 Other acute pancreatitis without necrosis or infection: Secondary | ICD-10-CM

## 2015-11-23 DIAGNOSIS — E860 Dehydration: Secondary | ICD-10-CM

## 2015-11-23 DIAGNOSIS — E1165 Type 2 diabetes mellitus with hyperglycemia: Secondary | ICD-10-CM

## 2015-11-23 HISTORY — DX: Acute pancreatitis without necrosis or infection, unspecified: K85.90

## 2015-11-23 HISTORY — DX: Essential (primary) hypertension: I10

## 2015-11-23 HISTORY — DX: Paroxysmal atrial fibrillation: I48.0

## 2015-11-23 LAB — URINE MICROSCOPIC-ADD ON

## 2015-11-23 LAB — URINALYSIS, ROUTINE W REFLEX MICROSCOPIC
BILIRUBIN URINE: NEGATIVE
GLUCOSE, UA: 100 mg/dL — AB
KETONES UR: NEGATIVE mg/dL
Leukocytes, UA: NEGATIVE
Nitrite: NEGATIVE
PH: 6 (ref 5.0–8.0)
Protein, ur: 100 mg/dL — AB
SPECIFIC GRAVITY, URINE: 1.02 (ref 1.005–1.030)

## 2015-11-23 LAB — I-STAT TROPONIN, ED: Troponin i, poc: 0 ng/mL (ref 0.00–0.08)

## 2015-11-23 LAB — CBC
HEMATOCRIT: 39.4 % (ref 39.0–52.0)
HEMOGLOBIN: 12.6 g/dL — AB (ref 13.0–17.0)
MCH: 23.7 pg — AB (ref 26.0–34.0)
MCHC: 32 g/dL (ref 30.0–36.0)
MCV: 74.1 fL — ABNORMAL LOW (ref 78.0–100.0)
Platelets: 185 10*3/uL (ref 150–400)
RBC: 5.32 MIL/uL (ref 4.22–5.81)
RDW: 14.9 % (ref 11.5–15.5)
WBC: 6.4 10*3/uL (ref 4.0–10.5)

## 2015-11-23 MED ORDER — SODIUM CHLORIDE 0.9 % IV BOLUS (SEPSIS)
1000.0000 mL | Freq: Once | INTRAVENOUS | Status: AC
  Administered 2015-11-23: 1000 mL via INTRAVENOUS

## 2015-11-23 MED ORDER — MORPHINE SULFATE (PF) 4 MG/ML IV SOLN
4.0000 mg | Freq: Once | INTRAVENOUS | Status: AC
  Administered 2015-11-23: 4 mg via INTRAVENOUS
  Filled 2015-11-23: qty 1

## 2015-11-23 MED ORDER — ONDANSETRON HCL 4 MG/2ML IJ SOLN
4.0000 mg | Freq: Once | INTRAMUSCULAR | Status: AC
  Administered 2015-11-23: 4 mg via INTRAVENOUS
  Filled 2015-11-23: qty 2

## 2015-11-23 NOTE — ED Notes (Signed)
Pt. reports worsening mid/upper abdominal pain onset Sunday this week with nausea and emesis , denies fever or diarrhea . Last BM today .

## 2015-11-23 NOTE — ED Provider Notes (Signed)
CSN: [id number]     Arrival date & time 11/23/15  2254 History  By signing my name below, I, Arianna Nassar, attest that this documentation has been prepared under the direction and in the presence of Shon Batonourtney F Horton, MD.  Electronically Signed: Octavia HeirArianna Nassar, ED Scribe. 11/23/2015. 11:55 PM.    Chief Complaint  Patient presents with  . Abdominal Pain     The history is provided by the patient. No language interpreter was used.   HPI Comments: [name] [name] is a 55 y.o. male who has a PMHx of DM presents to the Emergency Department complaining of intermittent, gradual worsening, moderate, sharp RUQ and epigastric abdominal pain onset 3 days ago. Pt has been having associated nausea, vomiting, chills, hot flashes, and diaphoresis. Per friend pt's pain started on "Sunday when he believed he was having acid reflux. He took Pepto Bismal and Tums to alleviate his symptoms with relief. She notes pt's pain got worse this evening after eating when he came home from work. Pt notes having two normal bowel movements this morning. He denies diarrhea or drug use. Denies alcohol abuse.  Past Medical History  Diagnosis Date  . Diabetes mellitus without complication (HCC)   . Essential hypertension   . Paroxysmal atrial fibrillation (HCC)     20" 14 with pancreatitis, resolved quickly  . Pancreatitis     Once 2012   History reviewed. No pertinent past surgical history. Family History  Problem Relation Age of Onset  . Diabetes Mother   . Chronic bronchitis Mother   . Lupus Maternal Aunt    Social History  Substance Use Topics  . Smoking status: Current Every Day Smoker    Types: Cigarettes  . Smokeless tobacco: None  . Alcohol Use: Yes     Comment: occ    Review of Systems  Constitutional: Positive for chills and diaphoresis.  Gastrointestinal: Positive for nausea, vomiting and abdominal pain. Negative for diarrhea.  All other systems reviewed and are negative.     Allergies  Bee  venom  Home Medications   Prior to Admission medications   Medication Sig Start Date End Date Taking? Authorizing Provider  baclofen (LIORESAL) 10 MG tablet Take 10 mg by mouth 3 (three) times daily. 10/26/15  Yes Historical Provider, MD  glyBURIDE-metformin (GLUCOVANCE) 5-500 MG per tablet Take 1 tablet by mouth 2 (two) times daily with a meal.   Yes Historical Provider, MD  Insulin Glargine (BASAGLAR KWIKPEN) 100 UNIT/ML SOPN Inject 45 Units into the skin daily. 09/13/15  Yes Historical Provider, MD  linagliptin (TRADJENTA) 5 MG TABS tablet Take 5 mg by mouth daily.   Yes Historical Provider, MD  lisinopril (PRINIVIL,ZESTRIL) 5 MG tablet Take 5 mg by mouth daily.   Yes Historical Provider, MD   Triage Vitals: BP 142/74 mmHg  Pulse 75  Temp(Src) 97.9 F (36.6 C) (Oral)  Resp 16  SpO2 100% Physical Exam  Constitutional: He is oriented to person, place, and time. He appears well-developed and well-nourished.  Uncomfortable appearing, diaphoretic  HENT:  Head: Normocephalic and atraumatic.  Cardiovascular: Normal rate, regular rhythm and normal heart sounds.   No murmur heard. Pulmonary/Chest: Effort normal and breath sounds normal. No respiratory distress. He has no wheezes.  Abdominal: Soft. Bowel sounds are normal. There is tenderness. There is no rebound and no guarding.  Epigastric and right upper quadrant tenderness to palpation without rebound or guarding  Musculoskeletal: He exhibits no edema.  Neurological: He is alert and oriented to person, place, and  time.  Skin: Skin is warm and dry.  Psychiatric: He has a normal mood and affect.  Nursing note and vitals reviewed.   ED Course  Procedures  DIAGNOSTIC STUDIES: Oxygen Saturation is 100% on RA, normal by my interpretation.  COORDINATION OF CARE:  11:51 PM Discussed treatment plan which includes lab work and pain medication with pt at bedside and pt agreed to plan.  Labs Review Labs Reviewed  LIPASE, BLOOD - Abnormal;  Notable for the following:    Lipase 203 (*)    All other components within normal limits  COMPREHENSIVE METABOLIC PANEL - Abnormal; Notable for the following:    Sodium 134 (*)    Chloride 97 (*)    Glucose, Bld 212 (*)    All other components within normal limits  CBC - Abnormal; Notable for the following:    Hemoglobin 12.6 (*)    MCV 74.1 (*)    MCH 23.7 (*)    All other components within normal limits  URINALYSIS, ROUTINE W REFLEX MICROSCOPIC (NOT AT [facility]) - Abnormal; Notable for the following:    Glucose, UA 100 (*)    Hgb urine dipstick TRACE (*)    Protein, ur 100 (*)    All other components within normal limits  URINE MICROSCOPIC-ADD ON - Abnormal; Notable for the following:    Squamous Epithelial / LPF 0-5 (*)    Bacteria, UA FEW (*)    Casts HYALINE CASTS (*)    All other components within normal limits  I-STAT TROPOININ, ED    Imaging Review US Abdomen Limited Ruq  11/24/2015  CLINICAL DATA:  Acute onset of right upper quadrant abdominal pain. Initial encounter. EXAM: US ABDOMEN LIMITED - RIGHT UPPER QUADRANT COMPARISON:  None. FINDINGS: Gallbladder: No gallstones or wall thickening visualized. Mild reverberation artifact is noted within the gallbladder. No sonographic Murphy sign noted by sonographer. Common bile duct: Diameter: 0.3 cm, within normal limits in caliber. Liver: No focal lesion identified. Within normal limits in parenchymal echogenicity. IMPRESSION: Unremarkable ultrasound of the right upper quadrant. Electronically Signed   By: Roanna Raider M.D.   On: 11/24/2015 01:09   I have personally reviewed and evaluated these images and lab results as part of my medical decision-making.   EKG Interpretation   Date/Time:  Tuesday November 23 2015 22:59:05 EDT Ventricular Rate:  76 PR Interval:  170 QRS Duration: 98 QT Interval:  364 QTC Calculation: 409 R Axis:   51 Text Interpretation:  Normal sinus rhythm Normal ECG Confirmed by HORTON   MD, COURTNEY  ([id number]) on 11/23/2015 11:11:38 PM      MDM   Final diagnoses:  RUQ pain  Other acute pancreatitis   Patient presents with abdominal pain.  Ill-appearing but nontoxic on exam. Vital signs reassuring. Tenderness palpation of the epigastrium or right upper quadrant. Considerations include pancreatitis, gastritis, peptic ulcer disease, gallbladder disease. He was given pain and nausea medication as well as fluids. Lab work notable for a lipase of 205. Denies history of alcohol abuse. Right upper quadrant ultrasound without evidence of gallstones. On multiple rechecks, patient continues to be ill-appearing and reports pain. No signs of peritonitis. Suspect pancreatitis as the etiology. Will admit for pain control.  I personally performed the services described in this documentation, which was scribed in my presence. The recorded information has been reviewed and is accurate.   Shon Baton, MD 11/24/15 [phone]

## 2015-11-24 ENCOUNTER — Encounter (HOSPITAL_COMMUNITY): Payer: Self-pay | Admitting: Family Medicine

## 2015-11-24 ENCOUNTER — Observation Stay (HOSPITAL_COMMUNITY): Payer: 59

## 2015-11-24 ENCOUNTER — Emergency Department (HOSPITAL_COMMUNITY): Payer: 59

## 2015-11-24 DIAGNOSIS — E1165 Type 2 diabetes mellitus with hyperglycemia: Secondary | ICD-10-CM

## 2015-11-24 DIAGNOSIS — E119 Type 2 diabetes mellitus without complications: Secondary | ICD-10-CM | POA: Diagnosis not present

## 2015-11-24 DIAGNOSIS — D649 Anemia, unspecified: Secondary | ICD-10-CM | POA: Diagnosis not present

## 2015-11-24 DIAGNOSIS — I10 Essential (primary) hypertension: Secondary | ICD-10-CM | POA: Diagnosis present

## 2015-11-24 DIAGNOSIS — K8500 Idiopathic acute pancreatitis without necrosis or infection: Secondary | ICD-10-CM | POA: Diagnosis not present

## 2015-11-24 DIAGNOSIS — K859 Acute pancreatitis without necrosis or infection, unspecified: Secondary | ICD-10-CM

## 2015-11-24 DIAGNOSIS — E871 Hypo-osmolality and hyponatremia: Secondary | ICD-10-CM

## 2015-11-24 DIAGNOSIS — Z794 Long term (current) use of insulin: Secondary | ICD-10-CM

## 2015-11-24 DIAGNOSIS — IMO0002 Reserved for concepts with insufficient information to code with codable children: Secondary | ICD-10-CM

## 2015-11-24 LAB — COMPREHENSIVE METABOLIC PANEL
ALBUMIN: 3.4 g/dL — AB (ref 3.5–5.0)
ALT: 22 U/L (ref 17–63)
ALT: 21 U/L (ref 17–63)
ANION GAP: 10 (ref 5–15)
AST: 21 U/L (ref 15–41)
AST: 19 U/L (ref 15–41)
Albumin: 3.9 g/dL (ref 3.5–5.0)
Alkaline Phosphatase: 48 U/L (ref 38–126)
Alkaline Phosphatase: 46 U/L (ref 38–126)
Anion gap: 9 (ref 5–15)
BILIRUBIN TOTAL: 0.4 mg/dL (ref 0.3–1.2)
BUN: 18 mg/dL (ref 6–20)
BUN: 17 mg/dL (ref 6–20)
CHLORIDE: 97 mmol/L — AB (ref 101–111)
CHLORIDE: 100 mmol/L — AB (ref 101–111)
CO2: 27 mmol/L (ref 22–32)
CO2: 26 mmol/L (ref 22–32)
CREATININE: 1 mg/dL (ref 0.61–1.24)
Calcium: 10.3 mg/dL (ref 8.9–10.3)
Calcium: 9.1 mg/dL (ref 8.9–10.3)
Creatinine, Ser: 1.11 mg/dL (ref 0.61–1.24)
GFR calc Af Amer: >60 mL/min (ref >60)
GFR calc non Af Amer: >60 mL/min (ref >60)
GLUCOSE: 221 mg/dL — AB (ref 65–99)
Glucose, Bld: 212 mg/dL — ABNORMAL HIGH (ref 65–99)
POTASSIUM: 3.7 mmol/L (ref 3.5–5.1)
Potassium: 4.3 mmol/L (ref 3.5–5.1)
SODIUM: 135 mmol/L (ref 135–145)
Sodium: 134 mmol/L — ABNORMAL LOW (ref 135–145)
TOTAL PROTEIN: 6.6 g/dL (ref 6.5–8.1)
Total Bilirubin: 0.4 mg/dL (ref 0.3–1.2)
Total Protein: 5.9 g/dL — ABNORMAL LOW (ref 6.5–8.1)

## 2015-11-24 LAB — CBC
HCT: 36.6 % — ABNORMAL LOW (ref 39.0–52.0)
HEMOGLOBIN: 11.3 g/dL — AB (ref 13.0–17.0)
MCH: 23.4 pg — AB (ref 26.0–34.0)
MCHC: 30.9 g/dL (ref 30.0–36.0)
MCV: 75.9 fL — ABNORMAL LOW (ref 78.0–100.0)
PLATELETS: 188 10*3/uL (ref 150–400)
RBC: 4.82 MIL/uL (ref 4.22–5.81)
RDW: 15.1 % (ref 11.5–15.5)
WBC: 8.7 10*3/uL (ref 4.0–10.5)

## 2015-11-24 LAB — IRON AND TIBC
IRON: 40 ug/dL — AB (ref 45–182)
SATURATION RATIOS: 12 % — AB (ref 17.9–39.5)
TIBC: 321 ug/dL (ref 250–450)
UIBC: 281 ug/dL

## 2015-11-24 LAB — LIPID PANEL
Cholesterol: 193 mg/dL (ref 0–200)
HDL: 52 mg/dL (ref >40)
LDL CALC: 135 mg/dL — AB (ref 0–99)
TRIGLYCERIDES: 31 mg/dL (ref <150)
Total CHOL/HDL Ratio: 3.7 RATIO
VLDL: 6 mg/dL (ref 0–40)

## 2015-11-24 LAB — LIPASE, BLOOD: LIPASE: 203 U/L — AB (ref 11–51)

## 2015-11-24 LAB — GLUCOSE, CAPILLARY
GLUCOSE-CAPILLARY: 143 mg/dL — AB (ref 65–99)
GLUCOSE-CAPILLARY: 213 mg/dL — AB (ref 65–99)
Glucose-Capillary: 147 mg/dL — ABNORMAL HIGH (ref 65–99)
Glucose-Capillary: 189 mg/dL — ABNORMAL HIGH (ref 65–99)
Glucose-Capillary: 70 mg/dL (ref 65–99)

## 2015-11-24 LAB — FERRITIN: Ferritin: 107 ng/mL (ref 24–336)

## 2015-11-24 MED ORDER — HYDROMORPHONE HCL 1 MG/ML IJ SOLN
1.0000 mg | Freq: Once | INTRAMUSCULAR | Status: AC
  Administered 2015-11-24: 1 mg via INTRAVENOUS
  Filled 2015-11-24: qty 1

## 2015-11-24 MED ORDER — ACETAMINOPHEN 325 MG PO TABS: 650.0000 mg | ORAL_TABLET | Freq: Four times a day (QID) | ORAL | Status: DC | PRN

## 2015-11-24 MED ORDER — OXYCODONE HCL 5 MG PO TABS: 5.0000 mg | ORAL_TABLET | ORAL | Status: DC | PRN

## 2015-11-24 MED ORDER — ONDANSETRON HCL 4 MG PO TABS: 4.0000 mg | ORAL_TABLET | Freq: Four times a day (QID) | ORAL | Status: DC | PRN

## 2015-11-24 MED ORDER — DIATRIZOATE MEGLUMINE & SODIUM 66-10 % PO SOLN
ORAL | Status: AC
  Filled 2015-11-24: qty 30

## 2015-11-24 MED ORDER — DIATRIZOATE MEGLUMINE & SODIUM 66-10 % PO SOLN
15.0000 mL | ORAL | Status: AC
  Administered 2015-11-24 (×2): 15 mL via ORAL
  Filled 2015-11-24: qty 30

## 2015-11-24 MED ORDER — INSULIN GLARGINE 100 UNIT/ML ~~LOC~~ SOLN
15.0000 [IU] | Freq: Every day | SUBCUTANEOUS | Status: DC
  Administered 2015-11-24 – 2015-11-26 (×3): 15 [IU] via SUBCUTANEOUS
  Filled 2015-11-24 (×3): qty 0.15

## 2015-11-24 MED ORDER — SODIUM CHLORIDE 0.9 % IV BOLUS (SEPSIS)
1000.0000 mL | Freq: Once | INTRAVENOUS | Status: AC
  Administered 2015-11-24: 1000 mL via INTRAVENOUS

## 2015-11-24 MED ORDER — LISINOPRIL 5 MG PO TABS
5.0000 mg | ORAL_TABLET | Freq: Every day | ORAL | Status: DC
  Administered 2015-11-24 – 2015-11-26 (×3): 5 mg via ORAL
  Filled 2015-11-24 (×3): qty 1

## 2015-11-24 MED ORDER — INSULIN ASPART 100 UNIT/ML ~~LOC~~ SOLN
0.0000 [IU] | Freq: Three times a day (TID) | SUBCUTANEOUS | Status: DC
  Administered 2015-11-24: 5 [IU] via SUBCUTANEOUS
  Administered 2015-11-24: 3 [IU] via SUBCUTANEOUS
  Administered 2015-11-25 (×2): 2 [IU] via SUBCUTANEOUS
  Administered 2015-11-26: 5 [IU] via SUBCUTANEOUS

## 2015-11-24 MED ORDER — ACETAMINOPHEN 650 MG RE SUPP: 650.0000 mg | Freq: Four times a day (QID) | RECTAL | Status: DC | PRN

## 2015-11-24 MED ORDER — POTASSIUM CHLORIDE IN NACL 20-0.9 MEQ/L-% IV SOLN
INTRAVENOUS | Status: DC
  Administered 2015-11-24 – 2015-11-26 (×7): via INTRAVENOUS
  Filled 2015-11-24 (×13): qty 1000

## 2015-11-24 MED ORDER — IOPAMIDOL (ISOVUE-300) INJECTION 61%
INTRAVENOUS | Status: AC
  Administered 2015-11-24: 100 mL
  Filled 2015-11-24: qty 100

## 2015-11-24 MED ORDER — ONDANSETRON HCL 4 MG/2ML IJ SOLN
4.0000 mg | Freq: Four times a day (QID) | INTRAMUSCULAR | Status: DC | PRN
  Administered 2015-11-24: 4 mg via INTRAVENOUS
  Filled 2015-11-24: qty 2

## 2015-11-24 MED ORDER — ENOXAPARIN SODIUM 40 MG/0.4ML ~~LOC~~ SOLN
40.0000 mg | SUBCUTANEOUS | Status: DC
  Administered 2015-11-24 – 2015-11-25 (×2): 40 mg via SUBCUTANEOUS
  Filled 2015-11-24 (×2): qty 0.4

## 2015-11-24 MED ORDER — HYDROMORPHONE HCL 1 MG/ML IJ SOLN
1.0000 mg | INTRAMUSCULAR | Status: DC | PRN
  Administered 2015-11-24: 1 mg via INTRAVENOUS
  Filled 2015-11-24: qty 1

## 2015-11-24 MED ORDER — INSULIN ASPART 100 UNIT/ML ~~LOC~~ SOLN
0.0000 [IU] | Freq: Every day | SUBCUTANEOUS | Status: DC
  Administered 2015-11-25: 2 [IU] via SUBCUTANEOUS

## 2015-11-24 MED ORDER — PROMETHAZINE HCL 25 MG PO TABS
25.0000 mg | ORAL_TABLET | Freq: Four times a day (QID) | ORAL | Status: DC | PRN
  Administered 2015-11-24: 25 mg via ORAL
  Filled 2015-11-24: qty 1

## 2015-11-24 MED ORDER — SENNOSIDES-DOCUSATE SODIUM 8.6-50 MG PO TABS: 1.0000 | ORAL_TABLET | Freq: Every evening | ORAL | Status: DC | PRN

## 2015-11-24 NOTE — H&P (Signed)
History and Physical  Patient Name: [name] [name]     ZOX:[id number]    DOB: 03-Aug-1960    DOA: 11/23/2015 PCP: Laurena Slimmer, MD   Patient coming from: Home  Chief Complaint: Epigastric pain  HPI: [name] [name] is a 55 y.o. male with a past medical history significant for IDDM and HTN who presents with epigastric pain for 2 days.  The patient was in his usual state of health until 14 with pancreatitis, resolved quickly  . Pancreatitis     Once 2012    History reviewed. No pertinent past surgical history.  Social History: Patient lives with his wife.  He works in Set designer.  The patient walks unassisted.  Minimal alcohol use.  He smokes.    Allergies  Allergen Reactions  . Bee Venom Swelling    Family history: family history includes Chronic bronchitis in his mother; Diabetes in his mother; Lupus in his maternal aunt.  Prior to Admission medications   Medication Sig Start Date End Date Taking? Authorizing Provider  baclofen (LIORESAL) 10 MG tablet Take 10 mg by mouth 3 (three) times daily. 10/26/15  Yes Historical Provider, MD  glyBURIDE-metformin (GLUCOVANCE) 5-500 MG per tablet Take 1 tablet by mouth 2 (two) times daily with a meal.   Yes Historical Provider, MD  Insulin Glargine (BASAGLAR KWIKPEN) 100 UNIT/ML SOPN Inject 45 Units into the skin daily. 09/13/15  Yes Historical Provider, MD  linagliptin (TRADJENTA) 5 MG TABS tablet Take 5 mg by mouth daily.   Yes Historical Provider, MD  lisinopril (PRINIVIL,ZESTRIL) 5 MG tablet Take 5 mg by mouth daily.   Yes Historical  Provider, MD       Physical Exam: BP 117/99 mmHg  Pulse 78  Temp(Src) 97.3 F (36.3 C) (Oral)  Resp 22  SpO2 97% General appearance: Well-developed, thin adult male, alert and in no acute distress, diaphoretic, but conversational.   Eyes: Anicteric, conjunctiva pink, lids and lashes normal.     ENT: No nasal deformity, discharge, or epistaxis.  OP tacky dry without lesions.   Lymph: No cervical or supraclavicular lymphadenopathy. Skin: Warm and dry.  No jaundice.  No suspicious rashes or  lesions.  Old abdominal scars. Cardiac: RRR, nl S1-S2, no murmurs appreciated.  Capillary refill is brisk.  JVP normal.  No LE edema.  Radial and DP pulses 2+ and symmetric. Respiratory: Normal respiratory rate and rhythm.  CTAB without rales or wheezes. Abdomen: Abdomen without rigidity.  Voluntary guarding and mild mostly epigastric TTP. No ascites, distension.   MSK: No deformities or effusions. Neuro: Sensorium intact and responding to questions, attention normal.  Speech is fluent.  Moves all extremities equally and with normal coordination.    Psych: Behavior appropriate.  Affect normal.  No evidence of aural or visual hallucinations or delusions.       Labs on Admission:  I have personally reviewed following labs and imaging studies: CBC:  Recent Labs Lab 11/23/15 2309  WBC 6.4  HGB 12.6*  HCT 39.4  MCV 74.1*  PLT 185   Basic Metabolic Panel:  Recent Labs Lab 11/23/15 2309  NA 134*  K 3.7  CL 97*  CO2 27  GLUCOSE 212*  BUN 18  CREATININE 1.11  CALCIUM 10.3   GFR: CrCl cannot be calculated (Unknown ideal weight.). Liver Function Tests:  Recent Labs Lab 11/23/15 2309  AST 21  ALT 22  ALKPHOS 48  BILITOT 0.4  PROT 6.6  ALBUMIN 3.9    Recent Labs Lab 11/23/15 2309  LIPASE 203*        Radiological Exams on Admission: Personally reviewed: [place] Abdomen Limited Ruq  11/24/2015  CLINICAL DATA:  Acute onset of right upper quadrant abdominal pain. Initial encounter. EXAM: US ABDOMEN LIMITED - RIGHT UPPER QUADRANT COMPARISON:  None. FINDINGS: Gallbladder: No gallstones or wall thickening visualized. Mild reverberation artifact is noted within the gallbladder. No sonographic Murphy sign noted by sonographer. Common bile duct: Diameter: 0.3 cm, within normal limits in caliber. Liver: No focal lesion identified. Within normal limits in parenchymal echogenicity. IMPRESSION: Unremarkable ultrasound of the right upper quadrant. Electronically Signed   By: Roanna RaiderJeffery   Chang M.D.   On: 11/24/2015 01:09    EKG: Independently reviewed. Rate 76, QTc 409, early repol pattern, no ST segment deviations.    Assessment/Plan 1. Pancreatitis:    Sharp constant epigastric pain with voimting, worse with food, elevated lipase.  RUQ US normal.  CT deferred.  Unclear inciting factor.  No alcohol or gallstone.  Baclofen? (given by Ortho for sciatica).   -Check lipids -Ondansetron and hydromorphone or oxycodone for pain -MIVF with K -Clears only, given that patient is already hungry   2. IDDM:  -Glargine 15 units while on clears -Sliding scale corrections  3. Anemia:  Maybe normal for gender/race, but microcytic.  -Check iron studies  4. Hyponatremia:  Expected in setting of pancreatitis -Repeat BMP after fluids  5. Hx of Afib and depressed EF:  -Reiterate to PCP that pt had echo with EF 45% and atrial fibrillation in 2012, without subsequent follow up -Outpatient Cardiology referral would be reasonable      DVT prophylaxis: Lovenox  Code Status: FULL  Family Communication: Wife at bedside  Disposition Plan: Anticipate fluids and pain medicine.  If able to advance diet during day today and take oral pain medication, conceivably home by tomorrow with BRAT diet.  If requiring continued fluids, IV pain medication, nausea medication, conceivably will need inpatient stay. Consults called: None Admission status: OBS, med surg At the point of initial evaluation, it is my clinical opinion that admission for OBSERVATION is reasonable and necessary because the patient's presenting complaints in the context of their chronic conditions represent sufficient risk of deterioration or significant morbidity to constitute reasonable grounds for close observation in the hospital setting, but that the patient may be medically stable for discharge from the hospital within 24 to 48 hours.    Medical decision making: Patient seen at 3:30 AM on 11/24/2015.  The patient was  discussed with Dr. Wilkie Aye. What exists of the patient's chart was reviewed in depth.  Clinical condition: stable.        Alberteen Sam [facility] Pager [phone]

## 2015-11-24 NOTE — Care Management Note (Signed)
Case Management Note  Patient Details  Name: Orlin HildingGregory Latimore MRN: [id number] Date of Birth: 1960-09-28  Subjective/Objective:                 Patient in obs for abd pain/ pancreatitis. Pain management. Following labs. No CM needs identified at this time.    Action/Plan:  Will continue to follow.   Expected Discharge Date:                  Expected Discharge Plan:  Home/Self Care  In-House Referral:     Discharge planning Services  CM Consult  Post Acute Care Choice:    Choice offered to:     DME Arranged:    DME Agency:     HH Arranged:    HH Agency:     Status of Service:  In process, will continue to follow  If discussed at Long Length of Stay Meetings, dates discussed:    Additional Comments:  Lawerance SabalDebbie Danaye Sobh, RN 11/24/2015, 2:08 PM

## 2015-11-24 NOTE — Progress Notes (Signed)
Patient states he vomited x3 and still having epigastric pain. Vital signs stable no other complaints of pain. Notified MD Dr. Blake DivineAkula new orders placed.

## 2015-11-24 NOTE — Progress Notes (Signed)
Orlin HildingGregory Low is a 55 y.o. male with a past medical history significant for IDDM and HTN who presents with epigastric pain for 2 days, was found to have acute pancreatitis. Made him NPO and ordered a ct abd and pelvis.  IV pain control, IV anti emetics. IV fluids.  Monitor.  Kathlen ModyVijaya Rhoda Waldvogel,  MD [phone]

## 2015-11-25 DIAGNOSIS — E871 Hypo-osmolality and hyponatremia: Secondary | ICD-10-CM

## 2015-11-25 DIAGNOSIS — E119 Type 2 diabetes mellitus without complications: Secondary | ICD-10-CM

## 2015-11-25 DIAGNOSIS — K8500 Idiopathic acute pancreatitis without necrosis or infection: Secondary | ICD-10-CM | POA: Diagnosis not present

## 2015-11-25 DIAGNOSIS — I10 Essential (primary) hypertension: Secondary | ICD-10-CM

## 2015-11-25 LAB — GLUCOSE, CAPILLARY
GLUCOSE-CAPILLARY: 227 mg/dL — AB (ref 65–99)
GLUCOSE-CAPILLARY: 213 mg/dL — AB (ref 65–99)
Glucose-Capillary: 148 mg/dL — ABNORMAL HIGH (ref 65–99)
Glucose-Capillary: 144 mg/dL — ABNORMAL HIGH (ref 65–99)
Glucose-Capillary: 62 mg/dL — ABNORMAL LOW (ref 65–99)

## 2015-11-25 LAB — BASIC METABOLIC PANEL
Anion gap: 7 (ref 5–15)
BUN: 8 mg/dL (ref 6–20)
CALCIUM: 9 mg/dL (ref 8.9–10.3)
CO2: 27 mmol/L (ref 22–32)
Chloride: 104 mmol/L (ref 101–111)
Creatinine, Ser: 0.92 mg/dL (ref 0.61–1.24)
GFR calc Af Amer: >60 mL/min (ref >60)
GLUCOSE: 124 mg/dL — AB (ref 65–99)
POTASSIUM: 4.2 mmol/L (ref 3.5–5.1)
Sodium: 138 mmol/L (ref 135–145)

## 2015-11-25 LAB — HEMOGLOBIN A1C
Hgb A1c MFr Bld: 9.2 % — ABNORMAL HIGH (ref 4.8–5.6)
MEAN PLASMA GLUCOSE: 217 mg/dL

## 2015-11-25 LAB — LIPASE, BLOOD: LIPASE: 77 U/L — AB (ref 11–51)

## 2015-11-25 MED ORDER — FERROUS SULFATE 325 (65 FE) MG PO TABS
325.0000 mg | ORAL_TABLET | Freq: Two times a day (BID) | ORAL | Status: DC
  Administered 2015-11-26: 325 mg via ORAL
  Filled 2015-11-25: qty 1

## 2015-11-25 NOTE — Progress Notes (Signed)
Patient's BG 62, patient given 8oz of apple juice, will recheck in 15 min and continue to monitor.

## 2015-11-25 NOTE — Progress Notes (Signed)
PROGRESS NOTE    [name] [name]  NWG:[id number] DOB: 05-Jan-1961 DOA: 11/23/2015 PCP: Laurena Slimmer, MD    Brief Narrative: [name] [name] is a 55 y.o. male with a past medical history significant for IDDM and HTN who presents with epigastric pain for 2 days  Assessment & Plan:   Principal Problem:   Pancreatitis Active Problems:   Diabetes mellitus without complication (HCC)   Essential hypertension   Hyponatremia   Anemia   Acute pancreatitis: Improving, initially NPO, started on clears, nausea, vomiting and abdominal pain improved.  Advance diet as tolerated.  Lipase normalized.  CT abd and pelvis does not show any signs of pancreatitis.  His triglyceride levels are within normal limits.    Diabetes Mellitus.  CBG (last 3)   Recent Labs  11/25/15 0748 11/25/15 1205 11/25/15 1732  GLUCAP 148* 144* 62*   Resume SSI.  hgba1c is 9.2.not well controlled.    HYPONATREMIA:  Improved.   Anemia: Microcytic.  Get anemia panel.  Iron levels low. Ferritin normal at 107.  Iron supplements will be started.    H/o of low EF on echo in 2012 and PAF: He was supposed to follow up with cardiologist as outpatient, and never did.  Repeat echo while in the hospital.   DVT prophylaxis: (Lovenox/) Code Status: (Full/) Family Communication: none atbedside Disposition Plan: home whenable to tolerate a regular diet and abd pain is resolved.   Consultants:   none   Procedures: CT abd and pelvis.  Echocardiogram.  Antimicrobials: none  Subjective: Pain ,nausea is better, no vomiting.   Objective: Filed Vitals:   11/24/15 2227 11/25/15 0455 11/25/15 0526 11/25/15 1402  BP: 133/57 114/62 127/72 129/79  Pulse: 66 67 67 72  Temp: 97.8 F (36.6 C) 98.5 F (36.9 C) 98.2 F (36.8 C) 97.7 F (36.5 C)  TempSrc: Oral Oral Oral   Resp: Height:      Weight:      SpO2: 100% 100% 100% 100%    Intake/Output Summary (Last 24 hours) at 11/25/15  1438 Last data filed at 11/25/15 0100  Gross per 24 hour  Intake 2581.67 ml  Output      0 ml  Net 2581.67 ml   Filed Weights   11/24/15 0511  Weight: 75.7 kg (166 lb 14.2 oz)    Examination:  General exam: Appears calm and comfortable  Respiratory system: Clear to auscultation. Respiratory effort normal. Cardiovascular system: S1 & S2 heard, RRR. No JVD, murmurs, rubs, gallops or clicks. No pedal edema. Gastrointestinal system: Abdomen is nondistended, soft and mild tenderness in the  Right upper quadrant and epigastric area. No organomegaly or masses felt. Normal bowel sounds heard. Central nervous system: Alert and oriented. No focal neurological deficits. Extremities: Symmetric 5 x 5 power. Skin: No rashes, lesions or ulcers Psychiatry: Judgement and insight appear normal. Mood & affect appropriate.     Data Reviewed: I have personally reviewed following labs and imaging studies  CBC:  Recent Labs Lab 11/23/15 2309 11/24/15 0608  WBC 6.4 8.7  HGB 12.6* 11.3*  HCT 39.4 36.6*  MCV 74.1* 75.9*  PLT 185 188   Basic Metabolic Panel:  Recent Labs Lab 11/23/15 2309 11/24/15 0608 11/25/15 0556  NA 134* 135 138  K 3.7 4.3 4.2  CL 97* 100* 104  CO2 GLUCOSE 212* 221* 124*  BUN CREATININE 1.11 1.00 0.92  CALCIUM 10.3 9.1 9.0   GFR: Estimated  Creatinine Clearance: 99.4 mL/min (by C-G formula based on Cr of 0.92). Liver Function Tests:  Recent Labs Lab 11/23/15 2309 11/24/15 0608  AST 21 19  ALT 22 21  ALKPHOS 48 46  BILITOT 0.4 0.4  PROT 6.6 5.9*  ALBUMIN 3.9 3.4*    Recent Labs Lab 11/23/15 2309 11/25/15 0556  LIPASE 203* 77*   No results for input(s): AMMONIA in the last 168 hours. Coagulation Profile: No results for input(s): INR, PROTIME in the last 168 hours. Cardiac Enzymes: No results for input(s): CKTOTAL, CKMB, CKMBINDEX, TROPONINI in the last 168 hours. BNP (last 3 results) No results for input(s): PROBNP in the  last 8760 hours. HbA1C:  Recent Labs  11/24/15 0608  HGBA1C 9.2*   CBG:  Recent Labs Lab 11/24/15 1710 11/24/15 2225 11/24/15 2352 11/25/15 0748 11/25/15 1205  GLUCAP 213* 70 147* 148* 144*   Lipid Profile:  Recent Labs  11/24/15 0608  CHOL 193  HDL 52  LDLCALC 135*  TRIG 31  CHOLHDL 3.7   Thyroid Function Tests: No results for input(s): TSH, T4TOTAL, FREET4, T3FREE, THYROIDAB in the last 72 hours. Anemia Panel:  Recent Labs  11/24/15 0608  FERRITIN 107  TIBC 321  IRON 40*   Sepsis Labs: No results for input(s): PROCALCITON, LATICACIDVEN in the last 168 hours.  No results found for this or any previous visit (from the past 240 hour(s)).       Radiology Studies: Ct Abdomen Pelvis W Contrast  11/24/2015  CLINICAL DATA:  Patient with severe upper abdominal pain for 4 days. History of pancreatitis. EXAM: CT ABDOMEN AND PELVIS WITH CONTRAST TECHNIQUE: Multidetector CT imaging of the abdomen and pelvis was performed using the standard protocol following bolus administration of intravenous contrast. CONTRAST:  100mL ISOVUE-300 IOPAMIDOL (ISOVUE-300) INJECTION 61% COMPARISON:  None. FINDINGS: Lung bases:  Clear.  Heart normal size. Hepatobiliary: Normal liver. Gallbladder is unremarkable. No bile duct dilation. Pancreas: Diffuse delayed areas of decreased attenuation in the pancreatic head and uncinate process which may reflect mildly dilated side ducts. No mass. No inflammation. Spleen: Unremarkable. Adrenal glands:  No masses. Kidneys, ureters, bladder: Small cortical low-density lesion along the posterior right kidney near the midpole, likely a cyst but too small to fully characterize. No other liver lesions. No stones. No hydronephrosis. Ureters are normal in course and caliber. Bladder is unremarkable. Vascular: There are atherosclerotic calcifications along the abdominal aorta and iliac vessels. No aneurysm. Lymph nodes:  No adenopathy. Ascites: None.  Gastrointestinal:  Unremarkable.  Normal appendix visualized. Musculoskeletal chronic bilateral pars defects at L5-S1 with a slight anterolisthesis. Otherwise unremarkable. IMPRESSION: 1. No acute findings within the abdomen pelvis. No evidence of pancreatitis. 2. 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 3. Atherosclerosis along the abdominal aorta and iliac vessels. Electronically Signed   By: Amie Portlandavid  Ormond M.D.   On: 11/24/2015 18:54   [place] Abdomen Limited Ruq  11/24/2015  CLINICAL DATA:  Acute onset of right upper quadrant abdominal pain. Initial encounter. EXAM: US ABDOMEN LIMITED - RIGHT UPPER QUADRANT COMPARISON:  None. FINDINGS: Gallbladder: No gallstones or wall thickening visualized. Mild reverberation artifact is noted within the gallbladder. No sonographic Murphy sign noted by sonographer. Common bile duct: Diameter: 0.3 cm, within normal limits in caliber. Liver: No focal lesion identified. Within normal limits in parenchymal echogenicity. IMPRESSION: Unremarkable ultrasound of the right upper quadrant. Electronically Signed   By: Roanna RaiderJeffery  Chang M.D.   On: 11/24/2015 01:09  Scheduled Meds: . enoxaparin (LOVENOX) injection  40 mg Subcutaneous Q24H  . insulin aspart  0-15 Units Subcutaneous TID WC  . insulin aspart  0-5 Units Subcutaneous QHS  . insulin glargine  15 Units Subcutaneous Daily  . lisinopril  5 mg Oral Daily   Continuous Infusions: . 0.9 % NaCl with KCl 20 mEq / L 150 mL/hr at 11/25/15 1241        Time spent: 25 minutes.     Kathlen ModyAKULA,Mayia Megill, MD [facility] Pager [phone]  If 7PM-7AM, please contact night-coverage [email or web address] Password [email or web address] 11/25/2015, 2:38 PM

## 2015-11-25 NOTE — Progress Notes (Signed)
Spoke with patient over the phone about diabetes and home regimen for diabetes control. Patient reports that he is followed by PCP for diabetes management and currently he takes Basaglar 30-45 units daily (based on glucose; does not take any if CGG < 150 mg/dl), Glucovance 06-1913-500 mg BID, Tradjenta 5 mg daily as an outpatient for diabetes control. Patient reports that he is taking DM medication as prescribed. Inquired about how often he is taking the Basaglar and patient reports that he takes a few times a week.  Patient states that he checks his glucose 1-2 times per day and that it is usually in the 100-200's mg/dl when he checks his glucose. Patient reports that his glucose may be in the 150's one day so he takes that Basaglar then the next day it may be 120 mg/dl so he does have to take the Basaglar. Discussed Basaglar and explained that it works for about 24 hours and that if he takes it one day and the next morning his glucose is good (less than 150 mg/dl) then he doesn't take it that day, his glucose is likely to continue to rise over the next 24 hours because the Basaglar is no longer working. Explained that Basaglar should be given every 24 hours to keep it in the system working to keep glucose controlled.  Inquired about prior A1C and patient reports that he does not recall his last A1C value. Discussed A1C results (9.2% on 11/24/15) and explained that his current A1C indicates an average glucose of 217 mg/dl over the past 2-3 months. Discussed glucose and A1C goals. Discussed importance of checking CBGs and maintaining good CBG control to prevent long-term and short-term complications. Explained how hyperglycemia leads to damage within blood vessels which lead to the common complications seen with uncontrolled diabetes. Stressed to the patient the importance of improving glycemic control to prevent further complications from uncontrolled diabetes. Discussed impact of nutrition, exercise, stress, sickness,  and medications on diabetes control. Patient stated that he may adjust his Basaglar to a lower dose and take it every day as it should be taken. Asked patient to talk with his doctor about making changes before he does it on his own. Encouraged patient to check his glucose 3-4 times per day (before meals and at bedtime) and to keep a log book of glucose readings and insulin taken which he will need to take to doctor appointments. Explained how his doctor can use the log book to continue to make insulin adjustments if needed.Patient was very appreciative of information discussed and stated our conversation clarified things for him especially with taking the Basaglar every 24 hours.  Patient verbalized understanding of information discussed and he states that he has no further questions at this time related to diabetes.  Thanks, Orlando PennerMarie Mickaela Starlin, RN, MSN, CDE Diabetes Coordinator [facility] [phone] (Team Pager) [phone] ([facility] office) [phone] [facility] office) [phone] [facility] office)

## 2015-11-26 ENCOUNTER — Observation Stay (HOSPITAL_BASED_OUTPATIENT_CLINIC_OR_DEPARTMENT_OTHER): Payer: 59

## 2015-11-26 DIAGNOSIS — E119 Type 2 diabetes mellitus without complications: Secondary | ICD-10-CM | POA: Diagnosis not present

## 2015-11-26 DIAGNOSIS — I10 Essential (primary) hypertension: Secondary | ICD-10-CM | POA: Diagnosis not present

## 2015-11-26 DIAGNOSIS — I509 Heart failure, unspecified: Secondary | ICD-10-CM | POA: Diagnosis not present

## 2015-11-26 DIAGNOSIS — E871 Hypo-osmolality and hyponatremia: Secondary | ICD-10-CM | POA: Diagnosis not present

## 2015-11-26 DIAGNOSIS — K8500 Idiopathic acute pancreatitis without necrosis or infection: Secondary | ICD-10-CM | POA: Diagnosis not present

## 2015-11-26 LAB — ECHOCARDIOGRAM COMPLETE
CHL CUP MV DEC (S): 215
CHL CUP STROKE VOLUME: 41 mL
EERAT: 5.44
EWDT: 215 ms
FS: 27 % — AB (ref 28–44)
Height: 74 in
IV/PV OW: 0.83
LA ID, A-P, ES: 35 mm
LA diam index: 1.74 cm/m2
LA vol: 46.4 mL
LAVOLA4C: 38.5 mL
LAVOLIN: 23.1 mL/m2
LDCA: 3.46 cm2
LEFT ATRIUM END SYS DIAM: 35 mm
LV PW d: 9.69 mm — AB (ref 0.6–1.1)
LV SIMPSON'S DISK: 55
LV dias vol: 74 mL (ref 62–150)
LV e' LATERAL: 13.2 cm/s
LV sys vol index: 16 mL/m2
LV sys vol: 33 mL (ref 21–61)
LVDIAVOLIN: 37 mL/m2
LVEEAVG: 5.44
LVEEMED: 5.44
LVOT diameter: 21 mm
MV pk E vel: 71.8 m/s
MVPG: 2 mmHg
MVPKAVEL: 39.3 m/s
TDI e' lateral: 13.2
TDI e' medial: 8.05
Weight: 2670.21 oz

## 2015-11-26 LAB — GLUCOSE, CAPILLARY
GLUCOSE-CAPILLARY: 116 mg/dL — AB (ref 65–99)
Glucose-Capillary: 250 mg/dL — ABNORMAL HIGH (ref 65–99)

## 2015-11-26 LAB — LIPASE, BLOOD: LIPASE: 32 U/L (ref 11–51)

## 2015-11-26 MED ORDER — INSULIN GLARGINE 100 UNIT/ML ~~LOC~~ SOLN: 15.0000 [IU] | Freq: Every day | SUBCUTANEOUS | Status: DC

## 2015-11-26 MED ORDER — FERROUS SULFATE 325 (65 FE) MG PO TABS
325.0000 mg | ORAL_TABLET | Freq: Two times a day (BID) | ORAL | Status: DC
Start: 2015-11-26 — End: 2016-05-16

## 2015-11-26 MED ORDER — OXYCODONE HCL 5 MG PO TABS: 5.0000 mg | ORAL_TABLET | ORAL | Status: DC | PRN

## 2015-11-26 NOTE — Progress Notes (Signed)
Nsg Discharge Note  Admit Date:  11/23/2015 Discharge date: 11/26/2015   Orlin HildingGregory Auld to be D/C'd Home per MD order.  AVS completed.  Copy for chart, and copy for patient signed, and dated. Patient/caregiver able to verbalize understanding.  Discharge Medication:   Medication List    STOP taking these medications        BASAGLAR KWIKPEN 100 UNIT/ML Sopn  Replaced by:  insulin glargine 100 UNIT/ML injection      TAKE these medications        baclofen 10 MG tablet  Commonly known as:  LIORESAL  Take 10 mg by mouth 3 (three) times daily.     ferrous sulfate 325 (65 FE) MG tablet  Take 1 tablet (325 mg total) by mouth 2 (two) times daily with a meal.     glyBURIDE-metformin 5-500 MG tablet  Commonly known as:  GLUCOVANCE  Take 1 tablet by mouth 2 (two) times daily with a meal.     insulin glargine 100 UNIT/ML injection  Commonly known as:  LANTUS  Inject 0.15 mLs (15 Units total) into the skin daily.     lisinopril 5 MG tablet  Commonly known as:  PRINIVIL,ZESTRIL  Take 5 mg by mouth daily.     oxyCODONE 5 MG immediate release tablet  Commonly known as:  Oxy IR/ROXICODONE  Take 1-2 tablets (5-10 mg total) by mouth every 4 (four) hours as needed for moderate pain.     TRADJENTA 5 MG Tabs tablet  Generic drug:  linagliptin  Take 5 mg by mouth daily.        Discharge Assessment: Filed Vitals:   11/26/15 0538 11/26/15 1420  BP: 127/61 112/86  Pulse: 81 70  Temp: 98.2 F (36.8 C) 97.6 F (36.4 C)  Resp: 16    Skin clean, dry and intact without evidence of skin break down, no evidence of skin tears noted. IV catheter discontinued intact. Site without signs and symptoms of complications - no redness or edema noted at insertion site, patient denies c/o pain - only slight tenderness at site.  Dressing with slight pressure applied.  D/c Instructions-Education: Discharge instructions given to patient/family with verbalized understanding. D/c education completed with  patient/family including follow up instructions, medication list, d/c activities limitations if indicated, with other d/c instructions as indicated by MD - patient able to verbalize understanding, all questions fully answered. Patient instructed to return to ED, call 911, or call MD for any changes in condition.  Patient escorted via WC, and D/C home via private auto.  Camillo FlamingVicki L Kirstein Baxley, RN 11/26/2015 5:01 PM

## 2015-11-26 NOTE — Discharge Summary (Signed)
Physician Discharge Summary  [name] [name] ZOX:[id number] DOB: 04/22/1961 DOA: 11/23/2015  PCP: Laurena Slimmer, MD  Admit date: 11/23/2015 Discharge date: 11/26/2015  Admitted From: home  Disposition:  Home.  Recommendations for Outpatient Follow-up:  1. Follow up with PCP in 1-2 weeks 2. Please follow up with endocrinology as we made changes in your lantus.     Discharge Condition:stable. CODE STATUS:full  Diet recommendation:  Carb Modified   Brief/Interim Summary: [name] [name] is a 55 y.o. male with a past medical history significant for IDDM and HTN who presents with epigastric pain for 2 days  Discharge Diagnoses:  Principal Problem:   Pancreatitis Active Problems:   Diabetes mellitus without complication (HCC)   Essential hypertension   Hyponatremia   Anemia  Acute pancreatitis: Resolved. initially NPO, started on clears, nausea, vomiting and abdominal pain improved.  Advance diet as tolerated.  Lipase normalized.  CT abd and pelvis does not show any signs of pancreatitis.  His triglyceride levels are within normal limits.  On soft diet without any problems.    Diabetes Mellitus.   Resume SSI.  Decreased teh dose of lantus to 15 units as his cbg was low yesterday evening.  hgba1c is 9.2. Recommend follow up with endocrinology on discharge.    HYPONATREMIA:  Improved.   Anemia: Microcytic.  Get anemia panel.  Iron levels low. Ferritin normal at 107.  Iron supplements will be started.    H/o of low EF on echo in 2012 and PAF: He was supposed to follow up with cardiologist as outpatient, and never did.  Repeat echo  Shows EF is back to 55%. Much improved. Marland Kitchen        Discharge Instructions  Discharge Instructions    Diet Carb Modified    Complete by:  As directed      Discharge instructions    Complete by:  As directed   Follow up with PCP in one week.  Please follow up with PCP regarding echocardiogram.             Medication List    STOP taking these medications        BASAGLAR KWIKPEN 100 UNIT/ML Sopn  Replaced by:  insulin glargine 100 UNIT/ML injection      TAKE these medications        baclofen 10 MG tablet  Commonly known as:  LIORESAL  Take 10 mg by mouth 3 (three) times daily.     ferrous sulfate 325 (65 FE) MG tablet  Take 1 tablet (325 mg total) by mouth 2 (two) times daily with a meal.     glyBURIDE-metformin 5-500 MG tablet  Commonly known as:  GLUCOVANCE  Take 1 tablet by mouth 2 (two) times daily with a meal.     insulin glargine 100 UNIT/ML injection  Commonly known as:  LANTUS  Inject 0.15 mLs (15 Units total) into the skin daily.     lisinopril 5 MG tablet  Commonly known as:  PRINIVIL,ZESTRIL  Take 5 mg by mouth daily.     oxyCODONE 5 MG immediate release tablet  Commonly known as:  Oxy IR/ROXICODONE  Take 1-2 tablets (5-10 mg total) by mouth every 4 (four) hours as needed for moderate pain.     TRADJENTA 5 MG Tabs tablet  Generic drug:  linagliptin  Take 5 mg by mouth daily.           Follow-up Information    Follow up with Laurena Slimmer, MD. Schedule an appointment as soon  as possible for a visit in 1 week.   Specialty:  Internal Medicine   Contact information:   [address] [place] [id number] [phone]      Allergies  Allergen Reactions  . Bee Venom Swelling    Consultations:  NONE   Procedures/Studies: Ct Abdomen Pelvis W Contrast  11/24/2015  CLINICAL DATA:  Patient with severe upper abdominal pain for 4 days. History of pancreatitis. EXAM: CT ABDOMEN AND PELVIS WITH CONTRAST TECHNIQUE: Multidetector CT imaging of the abdomen and pelvis was performed using the standard protocol following bolus administration of intravenous contrast. CONTRAST:  100mL ISOVUE-300 IOPAMIDOL (ISOVUE-300) INJECTION 61% COMPARISON:  None. FINDINGS: Lung bases:  Clear.  Heart normal size. Hepatobiliary: Normal liver. Gallbladder is  unremarkable. No bile duct dilation. Pancreas: Diffuse delayed areas of decreased attenuation in the pancreatic head and uncinate process which may reflect mildly dilated side ducts. No mass. No inflammation. Spleen: Unremarkable. Adrenal glands:  No masses. Kidneys, ureters, bladder: Small cortical low-density lesion along the posterior right kidney near the midpole, likely a cyst but too small to fully characterize. No other liver lesions. No stones. No hydronephrosis. Ureters are normal in course and caliber. Bladder is unremarkable. Vascular: There are atherosclerotic calcifications along the abdominal aorta and iliac vessels. No aneurysm. Lymph nodes:  No adenopathy. Ascites: None. Gastrointestinal:  Unremarkable.  Normal appendix visualized. Musculoskeletal chronic bilateral pars defects at L5-S1 with a slight anterolisthesis. Otherwise unremarkable. IMPRESSION: 1. No acute findings within the abdomen pelvis. No evidence of pancreatitis. 2. 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 3. Atherosclerosis along the abdominal aorta and iliac vessels. Electronically Signed   By: Amie Portlandavid  Ormond M.D.   On: 11/24/2015 18:54   [place] Abdomen Limited Ruq  11/24/2015  CLINICAL DATA:  Acute onset of right upper quadrant abdominal pain. Initial encounter. EXAM: US ABDOMEN LIMITED - RIGHT UPPER QUADRANT COMPARISON:  None. FINDINGS: Gallbladder: No gallstones or wall thickening visualized. Mild reverberation artifact is noted within the gallbladder. No sonographic Murphy sign noted by sonographer. Common bile duct: Diameter: 0.3 cm, within normal limits in caliber. Liver: No focal lesion identified. Within normal limits in parenchymal echogenicity. IMPRESSION: Unremarkable ultrasound of the right upper quadrant. Electronically Signed   By: Roanna RaiderJeffery  Chang M.D.   On: 11/24/2015 01:09    \ECHO  Subjective:  NO COMPLAINTS, NO NAUSEA, VOMITING.  Discharge Exam: Filed Vitals:   11/26/15  0538 11/26/15 1420  BP: 127/61 112/86  Pulse: 81 70  Temp: 98.2 F (36.8 C) 97.6 F (36.4 C)  Resp: 16    Filed Vitals:   11/25/15 1402 11/25/15 2228 11/26/15 0538 11/26/15 1420  BP: 129/79 126/78 127/61 112/86  Pulse: 72 75 81 70  Temp: 97.7 F (36.5 C) 97.9 F (36.6 C) 98.2 F (36.8 C) 97.6 F (36.4 C)  TempSrc:   Oral   Resp: 19 16 16    Height:      Weight:      SpO2: 100% 98% 100% 100%    General: Pt is alert, awake, not in acute distress Cardiovascular: RRR, S1/S2 +, no rubs, no gallops Respiratory: CTA bilaterally, no wheezing, no rhonchi Abdominal: Soft, NT, ND, bowel sounds + Extremities: no edema, no cyanosis    The results of significant diagnostics from this hospitalization (including imaging, microbiology, ancillary and laboratory) are listed below for reference.     Microbiology: No results found for this or any previous visit (from the past 240 hour(s)).  Labs: BNP (last 3 results) No results for input(s): BNP in the last 8760 hours. Basic Metabolic Panel:  Recent Labs Lab 11/23/15 2309 11/24/15 0608 11/25/15 0556  NA 134* 135 138  K 3.7 4.3 4.2  CL 97* 100* 104  CO2 27 26 27   GLUCOSE 212* 221* 124*  BUN 18 17 8   CREATININE 1.11 1.00 0.92  CALCIUM 10.3 9.1 9.0   Liver Function Tests:  Recent Labs Lab 11/23/15 2309 11/24/15 0608  AST 21 19  ALT 22 21  ALKPHOS 48 46  BILITOT 0.4 0.4  PROT 6.6 5.9*  ALBUMIN 3.9 3.4*    Recent Labs Lab 11/23/15 2309 11/25/15 0556 11/26/15 0611  LIPASE 203* 77* 32   No results for input(s): AMMONIA in the last 168 hours. CBC:  Recent Labs Lab 11/23/15 2309 11/24/15 0608  WBC 6.4 8.7  HGB 12.6* 11.3*  HCT 39.4 36.6*  MCV 74.1* 75.9*  PLT 185 188   Cardiac Enzymes: No results for input(s): CKTOTAL, CKMB, CKMBINDEX, TROPONINI in the last 168 hours. BNP: Invalid input(s): POCBNP CBG:  Recent Labs Lab 11/25/15 1732 11/25/15 1831 11/25/15 2031 11/26/15 0748 11/26/15 1210   GLUCAP 62* 227* 213* 116* 250*   D-Dimer No results for input(s): DDIMER in the last 72 hours. Hgb A1c  Recent Labs  11/24/15 0608  HGBA1C 9.2*   Lipid Profile  Recent Labs  11/24/15 0608  CHOL 193  HDL 52  LDLCALC 135*  TRIG 31  CHOLHDL 3.7   Thyroid function studies No results for input(s): TSH, T4TOTAL, T3FREE, THYROIDAB in the last 72 hours.  Invalid input(s): FREET3 Anemia work up  Recent Labs  11/24/15 0608  FERRITIN 107  TIBC 321  IRON 40*   Urinalysis    Component Value Date/Time   COLORURINE YELLOW 11/23/2015 2312   APPEARANCEUR CLEAR 11/23/2015 2312   LABSPEC 1.020 11/23/2015 2312   PHURINE 6.0 11/23/2015 2312   GLUCOSEU 100* 11/23/2015 2312   HGBUR TRACE* 11/23/2015 2312   BILIRUBINUR NEGATIVE 11/23/2015 2312   KETONESUR NEGATIVE 11/23/2015 2312   PROTEINUR 100* 11/23/2015 2312   UROBILINOGEN 0.2 09/05/2010 0945   NITRITE NEGATIVE 11/23/2015 2312   LEUKOCYTESUR NEGATIVE 11/23/2015 2312   Sepsis Labs Invalid input(s): PROCALCITONIN,  WBC,  LACTICIDVEN Microbiology No results found for this or any previous visit (from the past 240 hour(s)).   Time coordinating discharge: Over 30 minutes  SIGNED:   Kathlen ModyAKULA,Cait Locust, MD  [facility] 11/26/2015, 4:47 PM Pager [phone]  If 7PM-7AM, please contact night-coverage [email or web address] Password [email or web address]

## 2015-11-26 NOTE — Progress Notes (Signed)
Echocardiogram 2D Echocardiogram has been performed.  Nolon RodBrown, Tony 11/26/2015, 2:44 PM

## 2016-05-13 ENCOUNTER — Encounter (HOSPITAL_COMMUNITY): Payer: Self-pay | Admitting: Emergency Medicine

## 2016-05-13 ENCOUNTER — Emergency Department (HOSPITAL_COMMUNITY)
Admission: EM | Admit: 2016-05-13 | Discharge: 2016-05-13 | Disposition: A | Discharge status: Home / Self Care | Payer: 59 | Attending: Emergency Medicine | Admitting: Emergency Medicine

## 2016-05-13 DIAGNOSIS — Z794 Long term (current) use of insulin: Secondary | ICD-10-CM | POA: Insufficient documentation

## 2016-05-13 DIAGNOSIS — K8500 Idiopathic acute pancreatitis without necrosis or infection: Secondary | ICD-10-CM | POA: Insufficient documentation

## 2016-05-13 DIAGNOSIS — Z79899 Other long term (current) drug therapy: Secondary | ICD-10-CM | POA: Insufficient documentation

## 2016-05-13 DIAGNOSIS — I10 Essential (primary) hypertension: Secondary | ICD-10-CM | POA: Insufficient documentation

## 2016-05-13 DIAGNOSIS — F17210 Nicotine dependence, cigarettes, uncomplicated: Secondary | ICD-10-CM | POA: Insufficient documentation

## 2016-05-13 DIAGNOSIS — E119 Type 2 diabetes mellitus without complications: Secondary | ICD-10-CM | POA: Insufficient documentation

## 2016-05-13 LAB — COMPREHENSIVE METABOLIC PANEL
ALBUMIN: 4 g/dL (ref 3.5–5.0)
ALK PHOS: 54 U/L (ref 38–126)
ALT: 18 U/L (ref 17–63)
ANION GAP: 7 (ref 5–15)
AST: 16 U/L (ref 15–41)
BUN: 10 mg/dL (ref 6–20)
CHLORIDE: 98 mmol/L — AB (ref 101–111)
CO2: 31 mmol/L (ref 22–32)
Calcium: 10.2 mg/dL (ref 8.9–10.3)
Creatinine, Ser: 1 mg/dL (ref 0.61–1.24)
GFR calc non Af Amer: >60 mL/min (ref >60)
GLUCOSE: 184 mg/dL — AB (ref 65–99)
Potassium: 3.9 mmol/L (ref 3.5–5.1)
SODIUM: 136 mmol/L (ref 135–145)
Total Bilirubin: 0.4 mg/dL (ref 0.3–1.2)
Total Protein: 6.8 g/dL (ref 6.5–8.1)

## 2016-05-13 LAB — CBC WITH DIFFERENTIAL/PLATELET
Basophils Absolute: 0 10*3/uL (ref 0.0–0.1)
Basophils Relative: 0 %
Eosinophils Absolute: 0.1 10*3/uL (ref 0.0–0.7)
Eosinophils Relative: 1 %
HEMATOCRIT: 40.4 % (ref 39.0–52.0)
HEMOGLOBIN: 13.5 g/dL (ref 13.0–17.0)
LYMPHS ABS: 2.2 10*3/uL (ref 0.7–4.0)
Lymphocytes Relative: 31 %
MCH: 25 pg — AB (ref 26.0–34.0)
MCHC: 33.4 g/dL (ref 30.0–36.0)
MCV: 75 fL — AB (ref 78.0–100.0)
MONO ABS: 0.4 10*3/uL (ref 0.1–1.0)
MONOS PCT: 5 %
NEUTROS ABS: 4.7 10*3/uL (ref 1.7–7.7)
NEUTROS PCT: 63 %
Platelets: 265 10*3/uL (ref 150–400)
RBC: 5.39 MIL/uL (ref 4.22–5.81)
RDW: 14.2 % (ref 11.5–15.5)
WBC: 7.4 10*3/uL (ref 4.0–10.5)

## 2016-05-13 LAB — LIPASE, BLOOD: Lipase: 79 U/L — ABNORMAL HIGH (ref 11–51)

## 2016-05-13 MED ORDER — HYDROCODONE-ACETAMINOPHEN 5-325 MG PO TABS
1.0000 | ORAL_TABLET | Freq: Once | ORAL | Status: AC
  Administered 2016-05-13: 1 via ORAL
  Filled 2016-05-13: qty 1

## 2016-05-13 MED ORDER — ONDANSETRON 8 MG PO TBDP: 8.0000 mg | ORAL_TABLET | Freq: Three times a day (TID) | ORAL | 0 refills | Status: DC | PRN

## 2016-05-13 MED ORDER — ONDANSETRON 4 MG PO TBDP
8.0000 mg | ORAL_TABLET | Freq: Once | ORAL | Status: AC
  Administered 2016-05-13: 8 mg via ORAL
  Filled 2016-05-13: qty 2

## 2016-05-13 MED ORDER — OXYCODONE HCL 5 MG PO TABS: 5.0000 mg | ORAL_TABLET | Freq: Four times a day (QID) | ORAL | 0 refills | Status: DC | PRN

## 2016-05-13 NOTE — ED Triage Notes (Addendum)
Pt presents with complaint of abdominal pain that began Friday morning about 3 a.m.  Has had nausea, vomiting, but no diarrhea.  States it seems similar to his visit in June when he was diagnosed with pancreatitis.

## 2016-05-13 NOTE — ED Provider Notes (Signed)
[facility]-EMERGENCY DEPT Provider Note   CSN: [id number] Arrival date & time: 05/13/16  0303  By signing my name below, I, Alyssa GroveMartin Green, attest that this documentation has been prepared under the direction and in the presence of Zadie Rhineonald Jabria Loos, MD. Electronically Signed: Alyssa GroveMartin Green, ED Scribe. 05/13/16. 3:36 AM.   History   Chief Complaint Chief Complaint  Patient presents with  . Abdominal Pain   He states his pain and symptoms are similar to when he had pancreatitis. Pt cut out sprite and started to drink more water. He started to experience same pain on 12/8. Pt states pain is similar, but has not escalated to the severity that it did last time. Reports associated 1 episode of vomiting. Denies abdominal PSHx. Pt consumed alcohol last on 12/7 and drinks occasionally. Denies fever, diarrhea, constipation, blood in vomit, diaphoresis, hematuria, dysuria.   The history is provided by the patient. No language interpreter was used.  Abdominal Pain   This is a new problem. The current episode started 12 to 24 hours ago. The problem occurs constantly. The problem has not changed since onset.The pain is associated with an unknown factor. The pain is located in the generalized abdominal region. The pain is moderate. Associated symptoms include vomiting. Pertinent negatives include fever, diarrhea, constipation, dysuria and hematuria. Past workup does not include surgery.    Past Medical History:  Diagnosis Date  . Diabetes mellitus without complication (HCC)   . Essential hypertension   . Pancreatitis    Once 2012  . Paroxysmal atrial fibrillation (HCC)    2012 with pancreatitis, resolved quickly    Patient Active Problem List   Diagnosis Date Noted  . Diabetes mellitus without complication (HCC) 11/24/2015  . Essential hypertension 11/24/2015  . Pancreatitis 11/24/2015  . Hyponatremia 11/24/2015  . Anemia 11/24/2015   History reviewed. No pertinent surgical history.  Home  Medications    Prior to Admission medications   Medication Sig Start Date End Date Taking? Authorizing Provider  baclofen (LIORESAL) 10 MG tablet Take 10 mg by mouth 3 (three) times daily. 10/26/15   Historical Provider, MD  ferrous sulfate 325 (65 FE) MG tablet Take 1 tablet (325 mg total) by mouth 2 (two) times daily with a meal. 11/26/15   Kathlen ModyVijaya Akula, MD  glyBURIDE-metformin (GLUCOVANCE) 5-500 MG per tablet Take 1 tablet by mouth 2 (two) times daily with a meal.    Historical Provider, MD  insulin glargine (LANTUS) 100 UNIT/ML injection Inject 0.15 mLs (15 Units total) into the skin daily. 11/26/15   Kathlen ModyVijaya Akula, MD  linagliptin (TRADJENTA) 5 MG TABS tablet Take 5 mg by mouth daily.    Historical Provider, MD  lisinopril (PRINIVIL,ZESTRIL) 5 MG tablet Take 5 mg by mouth daily.    Historical Provider, MD  oxyCODONE (OXY IR/ROXICODONE) 5 MG immediate release tablet Take 1-2 tablets (5-10 mg total) by mouth every 4 (four) hours as needed for moderate pain. 11/26/15   Kathlen ModyVijaya Akula, MD   Family History Family History  Problem Relation Age of Onset  . Diabetes Mother   . Chronic bronchitis Mother   . Lupus Maternal Aunt     Social History Social History  Substance Use Topics  . Smoking status: Current Every Day Smoker    Packs/day: 0.50    Types: Cigarettes  . Smokeless tobacco: Never Used  . Alcohol use Yes     Comment: occ   Allergies   Bee venom  Review of Systems Review of Systems  Constitutional: Negative for  fever.  Cardiovascular: Negative for chest pain.  Gastrointestinal: Positive for abdominal pain and vomiting. Negative for constipation and diarrhea.  Genitourinary: Negative for dysuria and hematuria.  All other systems reviewed and are negative.  Physical Exam Updated Vital Signs BP 135/84 (BP Location: Left Arm)   Pulse 69   Temp 98 F (36.7 C) (Oral)   Resp 14   SpO2 100%   Physical Exam CONSTITUTIONAL: Well developed/well nourished HEAD:  Normocephalic/atraumatic EYES: EOMI/PERRL ENMT: Mucous membranes moist NECK: supple no meningeal signs SPINE/BACK:entire spine nontender CV: S1/S2 noted, no murmurs/rubs/gallops noted LUNGS: Lungs are clear to auscultation bilaterally, no apparent distress ABDOMEN: soft, mild diffuse tenderness, no rebound or guarding, bowel sounds noted throughout abdomen GU:no cva tenderness NEURO: Pt is awake/alert/appropriate, moves all extremitiesx4.  No facial droop.   EXTREMITIES: pulses normal/equal, full ROM SKIN: warm, color normal PSYCH: no abnormalities of mood noted, alert and oriented to situation  ED Treatments / Results  DIAGNOSTIC STUDIES: Oxygen Saturation is 100% on RA, normal by my interpretation.    COORDINATION OF CARE: 3:30 AM Discussed treatment plan with pt at bedside which includes blood work and pt agreed to plan.  Labs (all labs ordered are listed, but only abnormal results are displayed) Labs Reviewed  COMPREHENSIVE METABOLIC PANEL - Abnormal; Notable for the following:       Result Value   Chloride 98 (*)    Glucose, Bld 184 (*)    All other components within normal limits  CBC WITH DIFFERENTIAL/PLATELET - Abnormal; Notable for the following:    MCV 75.0 (*)    MCH 25.0 (*)    All other components within normal limits  LIPASE, BLOOD - Abnormal; Notable for the following:    Lipase 79 (*)    All other components within normal limits    EKG  EKG Interpretation  Date/Time:  Saturday May 13 2016 03:53:49 EST Ventricular Rate:  69 PR Interval:    QRS Duration: 81 QT Interval:  378 QTC Calculation: 405 R Axis:   57 Text Interpretation:  Sinus rhythm Probable anteroseptal infarct, old No significant change since last tracing Confirmed by Bebe ShaggyWICKLINE  MD, Shon Indelicato ([id number]) on 05/13/2016 4:01:51 AM      Radiology No results found.  Procedures Procedures   Medications Ordered in ED Medications  ondansetron (ZOFRAN-ODT) disintegrating tablet 8 mg (8 mg Oral  Given 05/13/16 0414)  HYDROcodone-acetaminophen (NORCO/VICODIN) 5-325 MG per tablet 1 tablet (1 tablet Oral Given 05/13/16 0414)     Initial Impression / Assessment and Plan / ED Course  I have reviewed the triage vital signs and the nursing notes.  Pertinent labs  results that were available during my care of the patient were reviewed by me and considered in my medical decision making (see chart for details).  Clinical Course    I personally performed the services described in this documentation, which was scribed in my presence. The recorded information has been reviewed and is accurate.      5:21 AM Pt here with recurrent pancreatitis He had episode earlier this year, but reports tonight is not as severe Lipase trending up, but he is overall well appearing/nontoxic and feels improved He feels comfortable with d/c home He will go to liquids then advance his diet Short course of pain meds ordered Advised to monitor/strict control of his glucose We discussed strict ER return precautions   Final Clinical Impressions(s) / ED Diagnoses   Final diagnoses:  Idiopathic acute pancreatitis without infection or necrosis  New Prescriptions New Prescriptions   ONDANSETRON (ZOFRAN ODT) 8 MG DISINTEGRATING TABLET    Take 1 tablet (8 mg total) by mouth every 8 (eight) hours as needed.   OXYCODONE (ROXICODONE) 5 MG IMMEDIATE RELEASE TABLET    Take 1 tablet (5 mg total) by mouth every 6 (six) hours as needed for severe pain.     Zadie Rhine, MD 05/13/16 [phone]

## 2016-05-15 ENCOUNTER — Encounter (HOSPITAL_COMMUNITY): Payer: Self-pay | Admitting: Emergency Medicine

## 2016-05-15 DIAGNOSIS — Z79891 Long term (current) use of opiate analgesic: Secondary | ICD-10-CM

## 2016-05-15 DIAGNOSIS — Z825 Family history of asthma and other chronic lower respiratory diseases: Secondary | ICD-10-CM

## 2016-05-15 DIAGNOSIS — E1143 Type 2 diabetes mellitus with diabetic autonomic (poly)neuropathy: Secondary | ICD-10-CM | POA: Diagnosis not present

## 2016-05-15 DIAGNOSIS — E871 Hypo-osmolality and hyponatremia: Secondary | ICD-10-CM | POA: Diagnosis present

## 2016-05-15 DIAGNOSIS — D509 Iron deficiency anemia, unspecified: Secondary | ICD-10-CM | POA: Diagnosis present

## 2016-05-15 DIAGNOSIS — E1165 Type 2 diabetes mellitus with hyperglycemia: Secondary | ICD-10-CM | POA: Diagnosis present

## 2016-05-15 DIAGNOSIS — E860 Dehydration: Secondary | ICD-10-CM | POA: Diagnosis present

## 2016-05-15 DIAGNOSIS — R1013 Epigastric pain: Secondary | ICD-10-CM | POA: Diagnosis not present

## 2016-05-15 DIAGNOSIS — K5900 Constipation, unspecified: Secondary | ICD-10-CM | POA: Diagnosis present

## 2016-05-15 DIAGNOSIS — K3184 Gastroparesis: Secondary | ICD-10-CM | POA: Diagnosis present

## 2016-05-15 DIAGNOSIS — Z23 Encounter for immunization: Secondary | ICD-10-CM

## 2016-05-15 DIAGNOSIS — Z794 Long term (current) use of insulin: Secondary | ICD-10-CM

## 2016-05-15 DIAGNOSIS — F17210 Nicotine dependence, cigarettes, uncomplicated: Secondary | ICD-10-CM | POA: Diagnosis present

## 2016-05-15 DIAGNOSIS — K2210 Ulcer of esophagus without bleeding: Secondary | ICD-10-CM | POA: Diagnosis present

## 2016-05-15 DIAGNOSIS — K219 Gastro-esophageal reflux disease without esophagitis: Secondary | ICD-10-CM | POA: Diagnosis present

## 2016-05-15 DIAGNOSIS — I10 Essential (primary) hypertension: Secondary | ICD-10-CM | POA: Diagnosis present

## 2016-05-15 DIAGNOSIS — I480 Paroxysmal atrial fibrillation: Secondary | ICD-10-CM | POA: Diagnosis present

## 2016-05-15 DIAGNOSIS — G43A1 Cyclical vomiting, intractable: Secondary | ICD-10-CM | POA: Diagnosis present

## 2016-05-15 DIAGNOSIS — G894 Chronic pain syndrome: Secondary | ICD-10-CM | POA: Diagnosis present

## 2016-05-15 DIAGNOSIS — Z833 Family history of diabetes mellitus: Secondary | ICD-10-CM

## 2016-05-15 LAB — CBC
HEMATOCRIT: 41.1 % (ref 39.0–52.0)
HEMOGLOBIN: 13.6 g/dL (ref 13.0–17.0)
MCH: 24.8 pg — ABNORMAL LOW (ref 26.0–34.0)
MCHC: 33.1 g/dL (ref 30.0–36.0)
MCV: 74.9 fL — AB (ref 78.0–100.0)
Platelets: 269 10*3/uL (ref 150–400)
RBC: 5.49 MIL/uL (ref 4.22–5.81)
RDW: 13.8 % (ref 11.5–15.5)
WBC: 6.8 10*3/uL (ref 4.0–10.5)

## 2016-05-15 NOTE — ED Triage Notes (Signed)
Patient reports persistent mid/upper abdominal pain with nausea and emesis onset Friday last week , denies diarrhea or fever .

## 2016-05-16 ENCOUNTER — Inpatient Hospital Stay (HOSPITAL_COMMUNITY)
Admission: EM | Admit: 2016-05-16 | Discharge: 2016-05-19 | DRG: 074 | Disposition: A | Discharge status: Home / Self Care | Payer: 59 | Attending: Internal Medicine | Admitting: Internal Medicine

## 2016-05-16 ENCOUNTER — Observation Stay (HOSPITAL_COMMUNITY): Payer: 59

## 2016-05-16 ENCOUNTER — Encounter (HOSPITAL_COMMUNITY): Payer: Self-pay | Admitting: Nurse Practitioner

## 2016-05-16 DIAGNOSIS — D649 Anemia, unspecified: Secondary | ICD-10-CM | POA: Diagnosis present

## 2016-05-16 DIAGNOSIS — K8500 Idiopathic acute pancreatitis without necrosis or infection: Secondary | ICD-10-CM

## 2016-05-16 DIAGNOSIS — Z794 Long term (current) use of insulin: Secondary | ICD-10-CM

## 2016-05-16 DIAGNOSIS — I10 Essential (primary) hypertension: Secondary | ICD-10-CM | POA: Diagnosis not present

## 2016-05-16 DIAGNOSIS — E1165 Type 2 diabetes mellitus with hyperglycemia: Secondary | ICD-10-CM

## 2016-05-16 DIAGNOSIS — K219 Gastro-esophageal reflux disease without esophagitis: Secondary | ICD-10-CM

## 2016-05-16 DIAGNOSIS — K859 Acute pancreatitis without necrosis or infection, unspecified: Secondary | ICD-10-CM | POA: Diagnosis present

## 2016-05-16 DIAGNOSIS — K5909 Other constipation: Secondary | ICD-10-CM | POA: Diagnosis not present

## 2016-05-16 DIAGNOSIS — E871 Hypo-osmolality and hyponatremia: Secondary | ICD-10-CM | POA: Diagnosis not present

## 2016-05-16 DIAGNOSIS — IMO0002 Reserved for concepts with insufficient information to code with codable children: Secondary | ICD-10-CM

## 2016-05-16 DIAGNOSIS — R1115 Cyclical vomiting syndrome unrelated to migraine: Secondary | ICD-10-CM

## 2016-05-16 DIAGNOSIS — R1013 Epigastric pain: Secondary | ICD-10-CM

## 2016-05-16 DIAGNOSIS — G894 Chronic pain syndrome: Secondary | ICD-10-CM | POA: Diagnosis present

## 2016-05-16 DIAGNOSIS — G8929 Other chronic pain: Secondary | ICD-10-CM

## 2016-05-16 DIAGNOSIS — K5900 Constipation, unspecified: Secondary | ICD-10-CM | POA: Diagnosis present

## 2016-05-16 DIAGNOSIS — K861 Other chronic pancreatitis: Secondary | ICD-10-CM

## 2016-05-16 DIAGNOSIS — E1140 Type 2 diabetes mellitus with diabetic neuropathy, unspecified: Secondary | ICD-10-CM | POA: Diagnosis present

## 2016-05-16 DIAGNOSIS — E118 Type 2 diabetes mellitus with unspecified complications: Secondary | ICD-10-CM

## 2016-05-16 HISTORY — DX: Family history of other specified conditions: Z84.89

## 2016-05-16 HISTORY — DX: Type 2 diabetes mellitus without complications: E11.9

## 2016-05-16 LAB — URINALYSIS, MICROSCOPIC (REFLEX)
RBC / HPF: NONE SEEN RBC/hpf (ref 0–5)
WBC, UA: NONE SEEN WBC/hpf (ref 0–5)

## 2016-05-16 LAB — URINALYSIS, ROUTINE W REFLEX MICROSCOPIC
BILIRUBIN URINE: NEGATIVE
Glucose, UA: NEGATIVE mg/dL
Ketones, ur: NEGATIVE mg/dL
Leukocytes, UA: NEGATIVE
Nitrite: NEGATIVE
PH: 5.5 (ref 5.0–8.0)
Protein, ur: NEGATIVE mg/dL

## 2016-05-16 LAB — GLUCOSE, CAPILLARY
GLUCOSE-CAPILLARY: 260 mg/dL — AB (ref 65–99)
GLUCOSE-CAPILLARY: 215 mg/dL — AB (ref 65–99)
GLUCOSE-CAPILLARY: 219 mg/dL — AB (ref 65–99)
Glucose-Capillary: 205 mg/dL — ABNORMAL HIGH (ref 65–99)

## 2016-05-16 LAB — PHOSPHORUS: PHOSPHORUS: 3.7 mg/dL (ref 2.5–4.6)

## 2016-05-16 LAB — COMPREHENSIVE METABOLIC PANEL
ALK PHOS: 51 U/L (ref 38–126)
ALT: 19 U/L (ref 17–63)
AST: 19 U/L (ref 15–41)
Albumin: 4.1 g/dL (ref 3.5–5.0)
Anion gap: 8 (ref 5–15)
BUN: 11 mg/dL (ref 6–20)
CHLORIDE: 98 mmol/L — AB (ref 101–111)
CO2: 27 mmol/L (ref 22–32)
CREATININE: 1.2 mg/dL (ref 0.61–1.24)
Calcium: 9.3 mg/dL (ref 8.9–10.3)
GFR calc Af Amer: >60 mL/min (ref >60)
Glucose, Bld: 147 mg/dL — ABNORMAL HIGH (ref 65–99)
Potassium: 3.8 mmol/L (ref 3.5–5.1)
SODIUM: 133 mmol/L — AB (ref 135–145)
Total Bilirubin: 0.5 mg/dL (ref 0.3–1.2)
Total Protein: 6.9 g/dL (ref 6.5–8.1)

## 2016-05-16 LAB — MAGNESIUM: MAGNESIUM: 1.4 mg/dL — AB (ref 1.7–2.4)

## 2016-05-16 LAB — LIPASE, BLOOD: LIPASE: 132 U/L — AB (ref 11–51)

## 2016-05-16 MED ORDER — METOCLOPRAMIDE HCL 5 MG/ML IJ SOLN
5.0000 mg | Freq: Four times a day (QID) | INTRAMUSCULAR | Status: DC
  Administered 2016-05-16 – 2016-05-17 (×4): 5 mg via INTRAVENOUS
  Filled 2016-05-16 (×4): qty 2

## 2016-05-16 MED ORDER — INSULIN ASPART 100 UNIT/ML ~~LOC~~ SOLN
0.0000 [IU] | SUBCUTANEOUS | Status: DC
  Administered 2016-05-16: 5 [IU] via SUBCUTANEOUS
  Administered 2016-05-16 (×3): 3 [IU] via SUBCUTANEOUS
  Administered 2016-05-17: 2 [IU] via SUBCUTANEOUS
  Administered 2016-05-17: 3 [IU] via SUBCUTANEOUS
  Administered 2016-05-17 (×2): 2 [IU] via SUBCUTANEOUS
  Administered 2016-05-17: 1 [IU] via SUBCUTANEOUS
  Administered 2016-05-17 – 2016-05-18 (×5): 2 [IU] via SUBCUTANEOUS
  Administered 2016-05-18: 1 [IU] via SUBCUTANEOUS
  Administered 2016-05-18 – 2016-05-19 (×5): 2 [IU] via SUBCUTANEOUS

## 2016-05-16 MED ORDER — ACETAMINOPHEN 650 MG RE SUPP: 650.0000 mg | Freq: Four times a day (QID) | RECTAL | Status: DC | PRN

## 2016-05-16 MED ORDER — MORPHINE SULFATE (PF) 2 MG/ML IV SOLN
1.0000 mg | INTRAVENOUS | Status: DC | PRN
  Administered 2016-05-16 (×2): 2 mg via INTRAVENOUS
  Administered 2016-05-16: 4 mg via INTRAVENOUS
  Administered 2016-05-16 – 2016-05-17 (×3): 2 mg via INTRAVENOUS
  Administered 2016-05-17: 4 mg via INTRAVENOUS
  Administered 2016-05-17 – 2016-05-18 (×4): 2 mg via INTRAVENOUS
  Administered 2016-05-18 (×2): 4 mg via INTRAVENOUS
  Administered 2016-05-18 – 2016-05-19 (×6): 2 mg via INTRAVENOUS
  Filled 2016-05-16 (×4): qty 1
  Filled 2016-05-16 (×2): qty 2
  Filled 2016-05-16: qty 1
  Filled 2016-05-16: qty 2
  Filled 2016-05-16 (×8): qty 1
  Filled 2016-05-16: qty 2
  Filled 2016-05-16 (×3): qty 1

## 2016-05-16 MED ORDER — FAMOTIDINE IN NACL 20-0.9 MG/50ML-% IV SOLN
20.0000 mg | Freq: Two times a day (BID) | INTRAVENOUS | Status: DC
  Administered 2016-05-16 – 2016-05-19 (×7): 20 mg via INTRAVENOUS
  Filled 2016-05-16 (×8): qty 50

## 2016-05-16 MED ORDER — ONDANSETRON HCL 4 MG/2ML IJ SOLN
4.0000 mg | Freq: Four times a day (QID) | INTRAMUSCULAR | Status: DC
  Administered 2016-05-16 – 2016-05-17 (×5): 4 mg via INTRAVENOUS
  Filled 2016-05-16 (×5): qty 2

## 2016-05-16 MED ORDER — PROMETHAZINE HCL 25 MG/ML IJ SOLN
25.0000 mg | Freq: Once | INTRAMUSCULAR | Status: AC
  Administered 2016-05-16: 25 mg via INTRAVENOUS
  Filled 2016-05-16: qty 1

## 2016-05-16 MED ORDER — ACETAMINOPHEN 325 MG PO TABS: 650.0000 mg | ORAL_TABLET | Freq: Four times a day (QID) | ORAL | Status: DC | PRN

## 2016-05-16 MED ORDER — ENOXAPARIN SODIUM 40 MG/0.4ML ~~LOC~~ SOLN
40.0000 mg | SUBCUTANEOUS | Status: DC
  Administered 2016-05-17 – 2016-05-19 (×3): 40 mg via SUBCUTANEOUS
  Filled 2016-05-16 (×3): qty 0.4

## 2016-05-16 MED ORDER — PNEUMOCOCCAL VAC POLYVALENT 25 MCG/0.5ML IJ INJ
0.5000 mL | INJECTION | INTRAMUSCULAR | Status: AC
  Administered 2016-05-17: 0.5 mL via INTRAMUSCULAR
  Filled 2016-05-16: qty 0.5

## 2016-05-16 MED ORDER — ONDANSETRON HCL 4 MG/2ML IJ SOLN
4.0000 mg | Freq: Once | INTRAMUSCULAR | Status: AC
  Administered 2016-05-16: 4 mg via INTRAVENOUS
  Filled 2016-05-16: qty 2

## 2016-05-16 MED ORDER — SODIUM CHLORIDE 0.9 % IV SOLN
INTRAVENOUS | Status: DC
  Administered 2016-05-16 – 2016-05-19 (×6): via INTRAVENOUS

## 2016-05-16 MED ORDER — KETOROLAC TROMETHAMINE 15 MG/ML IJ SOLN
15.0000 mg | Freq: Three times a day (TID) | INTRAMUSCULAR | Status: DC
  Administered 2016-05-16 – 2016-05-17 (×4): 15 mg via INTRAVENOUS
  Filled 2016-05-16 (×4): qty 1

## 2016-05-16 MED ORDER — MORPHINE SULFATE (PF) 4 MG/ML IV SOLN
4.0000 mg | Freq: Once | INTRAVENOUS | Status: AC
  Administered 2016-05-16: 4 mg via INTRAVENOUS
  Filled 2016-05-16: qty 1

## 2016-05-16 MED ORDER — PROMETHAZINE HCL 25 MG/ML IJ SOLN
25.0000 mg | Freq: Four times a day (QID) | INTRAMUSCULAR | Status: DC | PRN
  Administered 2016-05-16 – 2016-05-17 (×5): 25 mg via INTRAVENOUS
  Filled 2016-05-16 (×5): qty 1

## 2016-05-16 MED ORDER — HYDRALAZINE HCL 20 MG/ML IJ SOLN
10.0000 mg | INTRAMUSCULAR | Status: DC | PRN
  Administered 2016-05-18: 10 mg via INTRAVENOUS
  Filled 2016-05-16 (×2): qty 1

## 2016-05-16 MED ORDER — INFLUENZA VAC SPLIT QUAD 0.5 ML IM SUSY
0.5000 mL | PREFILLED_SYRINGE | INTRAMUSCULAR | Status: AC
  Administered 2016-05-17: 0.5 mL via INTRAMUSCULAR
  Filled 2016-05-16: qty 0.5

## 2016-05-16 MED ORDER — SODIUM CHLORIDE 0.9 % IV BOLUS (SEPSIS)
1000.0000 mL | Freq: Once | INTRAVENOUS | Status: AC
  Administered 2016-05-16: 1000 mL via INTRAVENOUS

## 2016-05-16 MED ORDER — ONDANSETRON HCL 4 MG/2ML IJ SOLN: 4.0000 mg | Freq: Four times a day (QID) | INTRAMUSCULAR | Status: DC | PRN

## 2016-05-16 NOTE — ED Provider Notes (Signed)
[facility]-EMERGENCY DEPT Provider Note   CSN: [id number] Arrival date & time: 05/15/16  2308  By signing my name below, I, Arianna Nassar, attest that this documentation has been prepared under the direction and in the presence of Shon Baton, MD.  Electronically Signed: Octavia Heir, ED Scribe. 05/16/16. 3:54 AM.    History   Chief Complaint Chief Complaint  Patient presents with  . Abdominal Pain    The history is provided by the patient. No language interpreter was used.   HPI Comments: [name] [name] is a 55 y.o. male who has a PMhx of DM, a-fib, and pancreatitis presents to the Emergency Department complaining of intermittent, gradual worsening, mid-upper abdominal pain x 4 days. Pt has been having associated constipation, nausea and vomiting. He has not had a bowel movement for the past 3 days. Pt believe he is having a flare up of pancreatitis. He was seen on 12/09 for the same symptoms and was given Zofran and oxycodone without relief. Pt says he has not been eating well due to his symptoms and notes he is vomiting everything back up. He denies fever, diarrhea, chills, and recent ETOH use.    Past Medical History:  Diagnosis Date  . Diabetes mellitus without complication (HCC)   . Essential hypertension   . Pancreatitis    Once 2012  . Paroxysmal atrial fibrillation (HCC)    2012 with pancreatitis, resolved quickly    Patient Active Problem List   Diagnosis Date Noted  . Constipation 05/16/2016  . Diabetic neuropathy (HCC) 05/16/2016  . Chronic pain syndrome 05/16/2016  . Insulin dependent type 2 diabetes mellitus, uncontrolled (HCC) 11/24/2015  . Essential hypertension 11/24/2015  . Pancreatitis 11/24/2015  . Dehydration with hyponatremia 11/24/2015  . Anemia 11/24/2015    History reviewed. No pertinent surgical history.     Home Medications    Prior to Admission medications   Medication Sig Start Date End Date Taking? Authorizing Provider    glyBURIDE-metformin (GLUCOVANCE) 5-500 MG per tablet Take 1 tablet by mouth 2 (two) times daily with a meal.   Yes Historical Provider, MD  Insulin Glargine (BASAGLAR KWIKPEN) 100 UNIT/ML SOPN Inject 0-45 Units into the skin 2 (two) times daily. Sliding scale   Yes Historical Provider, MD  linagliptin (TRADJENTA) 5 MG TABS tablet Take 5 mg by mouth daily.   Yes Historical Provider, MD  lisinopril (PRINIVIL,ZESTRIL) 5 MG tablet Take 5 mg by mouth daily.   Yes Historical Provider, MD  ondansetron (ZOFRAN ODT) 8 MG disintegrating tablet Take 1 tablet (8 mg total) by mouth every 8 (eight) hours as needed. Patient taking differently: Take 8 mg by mouth every 8 (eight) hours as needed for nausea or vomiting.  05/13/16  Yes Zadie Rhine, MD  oxyCODONE (ROXICODONE) 5 MG immediate release tablet Take 1 tablet (5 mg total) by mouth every 6 (six) hours as needed for severe pain. 05/13/16  Yes Zadie Rhine, MD    Family History Family History  Problem Relation Age of Onset  . Diabetes Mother   . Chronic bronchitis Mother   . Lupus Maternal Aunt     Social History Social History  Substance Use Topics  . Smoking status: Current Every Day Smoker    Packs/day: 0.50    Types: Cigarettes  . Smokeless tobacco: Never Used  . Alcohol use Yes     Comment: occ     Allergies   Bee venom   Review of Systems Review of Systems  Constitutional: Negative for chills  and fever.  Respiratory: Negative for shortness of breath.   Cardiovascular: Negative for chest pain.  Gastrointestinal: Positive for abdominal pain, constipation, nausea and vomiting. Negative for diarrhea.  All other systems reviewed and are negative.    Physical Exam Updated Vital Signs BP (!) 149/94 (BP Location: Left Arm)   Pulse 62   Temp 97.7 F (36.5 C) (Oral)   Resp 16   Ht 6\' 1"  (1.854 m)   Wt 160 lb 11.5 oz (72.9 kg)   SpO2 100%   BMI 21.20 kg/m   Physical Exam  Constitutional: He is oriented to person, place,  and time. He appears well-developed and well-nourished. No distress.  HENT:  Head: Normocephalic and atraumatic.  Cardiovascular: Normal rate, regular rhythm and normal heart sounds.   No murmur heard. Pulmonary/Chest: Effort normal and breath sounds normal. No respiratory distress. He has no wheezes.  Abdominal: Soft. Bowel sounds are normal. There is tenderness. There is no rebound and no guarding.  Epigastric tenderness to palpation without rebound or guarding  Musculoskeletal: He exhibits no edema.  Neurological: He is alert and oriented to person, place, and time.  Skin: Skin is warm and dry.  Psychiatric: He has a normal mood and affect.  Nursing note and vitals reviewed.    ED Treatments / Results  DIAGNOSTIC STUDIES: Oxygen Saturation is 99% on RA, normal by my interpretation.  COORDINATION OF CARE:  3:46 AM Discussed treatment plan with pt at bedside and pt agreed to plan.  Labs (all labs ordered are listed, but only abnormal results are displayed) Labs Reviewed  LIPASE, BLOOD - Abnormal; Notable for the following:       Result Value   Lipase 132 (*)    All other components within normal limits  COMPREHENSIVE METABOLIC PANEL - Abnormal; Notable for the following:    Sodium 133 (*)    Chloride 98 (*)    Glucose, Bld 147 (*)    All other components within normal limits  CBC - Abnormal; Notable for the following:    MCV 74.9 (*)    MCH 24.8 (*)    All other components within normal limits  URINALYSIS, ROUTINE W REFLEX MICROSCOPIC - Abnormal; Notable for the following:    Specific Gravity, Urine <1.005 (*)    Hgb urine dipstick TRACE (*)    All other components within normal limits  URINALYSIS, MICROSCOPIC (REFLEX) - Abnormal; Notable for the following:    Bacteria, UA RARE (*)    Squamous Epithelial / LPF 0-5 (*)    All other components within normal limits  GLUCOSE, CAPILLARY - Abnormal; Notable for the following:    Glucose-Capillary 205 (*)    All other  components within normal limits  HEMOGLOBIN A1C  MAGNESIUM  PHOSPHORUS    EKG  EKG Interpretation None       Radiology Dg Abd Portable 1v  Result Date: 05/16/2016 CLINICAL DATA:  Abdominal pain, vomiting for a few days EXAM: PORTABLE ABDOMEN - 1 VIEW COMPARISON:  None. FINDINGS: The bowel gas pattern is normal. No radio-opaque calculi or other significant radiographic abnormality are seen. IMPRESSION: Negative. Electronically Signed   By: Elige KoHetal  Patel   On: 05/16/2016 08:03    Procedures Procedures (including critical care time)  Medications Ordered in ED Medications  0.9 %  sodium chloride infusion ( Intravenous New Bag/Given 05/16/16 0829)  insulin aspart (novoLOG) injection 0-9 Units (not administered)  promethazine (PHENERGAN) injection 25 mg (not administered)  ondansetron (ZOFRAN) injection 4 mg (4 mg  Intravenous Given 05/16/16 0820)  metoCLOPramide (REGLAN) injection 5 mg (5 mg Intravenous Given 05/16/16 0820)  morphine 2 MG/ML injection 1-4 mg (2 mg Intravenous Given 05/16/16 0821)  enoxaparin (LOVENOX) injection 40 mg (0 mg Subcutaneous Hold 05/16/16 0900)  acetaminophen (TYLENOL) tablet 650 mg (not administered)    Or  acetaminophen (TYLENOL) suppository 650 mg (not administered)  ketorolac (TORADOL) 15 MG/ML injection 15 mg (not administered)  famotidine (PEPCID) IVPB 20 mg premix (not administered)  hydrALAZINE (APRESOLINE) injection 10 mg (not administered)  morphine 4 MG/ML injection 4 mg (4 mg Intravenous Given 05/16/16 0447)  ondansetron (ZOFRAN) injection 4 mg (4 mg Intravenous Given 05/16/16 0447)  sodium chloride 0.9 % bolus 1,000 mL (0 mLs Intravenous Stopped 05/16/16 0556)  promethazine (PHENERGAN) injection 25 mg (25 mg Intravenous Given 05/16/16 0551)     Initial Impression / Assessment and Plan / ED Course  I have reviewed the triage vital signs and the nursing notes.  Pertinent labs & imaging results that were available during my care of the  patient were reviewed by me and considered in my medical decision making (see chart for details).  Clinical Course     A short presents with persistent epigastric pain. History of chronic pancreatitis. Was seen 2 days ago for the same and has continued symptoms with Zofran and oxycodone at home. Lipase is trending upwards and is now 132. Patient given fluids, pain, and nausea medication. He reports persistent vomiting despite hydration and symptom control. Will admit for observation.  I personally performed the services described in this documentation, which was scribed in my presence. The recorded information has been reviewed and is accurate.  Final Clinical Impressions(s) / ED Diagnoses   Final diagnoses:  Idiopathic chronic pancreatitis [facility](HCC)    New Prescriptions Current Discharge Medication List       Shon Batonourtney F Joelee Snoke, MD 05/16/16 2311

## 2016-05-16 NOTE — H&P (Signed)
History and Physical    [name] [name] ZOX:[id number] DOB: Apr 30, 1961 DOA: 05/16/2016   PCP: Laurena Slimmer, MD   Patient coming from/Resides with: Private residence/lives with spouse  Admission status: Observation/floor -it may be medically necessary to stay a minimum 2 midnights to rule out impending and/or unexpected changes in physiologic status that may differ from initial evaluation performed in the ER and/or at time of admission therefore please consider reevaluation of admission status within the next 24 hours.   Chief Complaint: Abdominal pain, nausea and vomiting without diarrhea  HPI: [name] [name] is a 55 y.o. male with medical history significant for diabetes mellitus on both oral agents and short acting insulin, hypertension, tobacco abuse, chronic neuropathic pain and anemia. Patient reports that for the past 5-6 days he's had issues with nausea and vomiting without diarrhea. No BM for several days. Patient was initially seen in the ER on 12/9 for the same symptoms and was given a prescription of Zofran and Norco to utilize. Unfortunately his symptoms have not improved and have actually worsened. It was presumed he had a mild case of pancreatitis noting lipase was 79.. Today his lipase is 132. Patient has a history of being hospitalized in June with acute pancreatitis with lipase 203. Abdominal ultrasound and CT abdomen and pelvis unremarkable and no evidence of pancreatitis. Patient reports that since previous hospitalization he has had milder recurrences of GI symptoms with the most recent episode after Thanksgiving of this year that was not severe enough to warrant presentation to a physician for evaluation. Of note in the past (and currently) LFTs have been normal. His triglycerides have been normal as well. Patient reports issues with severe chronic pain in his legs related to his diabetes for which he takes medications. He admits to occasional use of marijuana to supplement his  pain medications but denies regular use of marijuana  ED Course: Vital Signs: BP (!) 149/94 (BP Location: Left Arm)   Pulse 62   Temp 97.7 F (36.5 C) (Oral)   Resp 16   Ht 6\' 1"  (1.854 m)   Wt 72.9 kg (160 lb 11.5 oz)   SpO2 100%   BMI 21.20 kg/m  DG Portable abdominal: Negative for obstructive pattern, moderate stool blurred and right colon Lab data: Sodium 133, potassium 3.8, chloride 98, BUN 11, creatinine 1.2, glucose 147, LFTs normal, lipase 132, white count 6800 differential not obtained, hemoglobin 13.6, platelets 269,000, urinalysis unremarkable except for trace hemoglobin, rare bacteria, specific gravity less than 1.005 Medications and treatments: Morphine 4 mg IV 1, Zofran 4 mg IV 1, normal saline bolus 1 L  Review of Systems:  In addition to the HPI above,  No Fever-chills, myalgias or other constitutional symptoms No Headache, changes with Vision or hearing, new weakness, tingling, numbness in any extremity, dizziness, dysarthria or word finding difficulty, gait disturbance or imbalance, tremors or seizure activity No problems swallowing food or Liquids, indigestion/reflux, choking or coughing while eating, abdominal pain with or after eating No Chest pain, Cough or Shortness of Breath, palpitations, orthopnea or DOE No Abdominal pain, N/V, melena,hematochezia, dark tarry stools, constipation No dysuria, malodorous urine, hematuria or flank pain No new skin rashes, lesions, masses or bruises, No new joint pains, aches, swelling or redness No recent unintentional weight gain or loss No polyuria, polydypsia or polyphagia   Past Medical History:  Diagnosis Date  . Diabetes mellitus without complication (HCC)   . Essential hypertension   . Pancreatitis    Once 2012  . Paroxysmal  atrial fibrillation (HCC)    2012 with pancreatitis, resolved quickly    History reviewed. No pertinent surgical history.  Social History   Social History  . Marital status: Single     Spouse name: N/A  . Number of children: N/A  . Years of education: N/A   Occupational History  . Not on file.   Social History Main Topics  . Smoking status: Current Every Day Smoker    Packs/day: 0.50    Types: Cigarettes  . Smokeless tobacco: Never Used  . Alcohol use Yes     Comment: occ  . Drug use:     Types: Marijuana     Comment: Not daily-reports utilize this as needed to supplement chronic pain medications  . Sexual activity: Yes   Other Topics Concern  . Not on file   Social History Narrative  . No narrative on file    Mobility: Without assistive devices Work history: Patient reports had to quit his previous employment due to health issues over the past 12 months; reports he is in college now studying criminal justice   Allergies  Allergen Reactions  . Bee Venom Swelling    Family History  Problem Relation Age of Onset  . Diabetes Mother   . Chronic bronchitis Mother   . Lupus Maternal Aunt      Prior to Admission medications   Medication Sig Start Date End Date Taking? Authorizing Provider  glyBURIDE-metformin (GLUCOVANCE) 5-500 MG per tablet Take 1 tablet by mouth 2 (two) times daily with a meal.   Yes Historical Provider, MD  Insulin Glargine (BASAGLAR KWIKPEN) 100 UNIT/ML SOPN Inject 0-45 Units into the skin 2 (two) times daily. Sliding scale   Yes Historical Provider, MD  linagliptin (TRADJENTA) 5 MG TABS tablet Take 5 mg by mouth daily.   Yes Historical Provider, MD  lisinopril (PRINIVIL,ZESTRIL) 5 MG tablet Take 5 mg by mouth daily.   Yes Historical Provider, MD  ondansetron (ZOFRAN ODT) 8 MG disintegrating tablet Take 1 tablet (8 mg total) by mouth every 8 (eight) hours as needed. Patient taking differently: Take 8 mg by mouth every 8 (eight) hours as needed for nausea or vomiting.  05/13/16  Yes Zadie Rhineonald Wickline, MD  oxyCODONE (ROXICODONE) 5 MG immediate release tablet Take 1 tablet (5 mg total) by mouth every 6 (six) hours as needed for severe  pain. 05/13/16  Yes Zadie Rhineonald Wickline, MD    Physical Exam: Vitals:   05/16/16 0700 05/16/16 0758 05/16/16 0759 05/16/16 0820  BP: 155/99 (!) 149/94    Pulse: 67 62    Resp:  16    Temp:  98 F (36.7 C)  97.7 F (36.5 C)  TempSrc:  Oral  Oral  SpO2: 100% 100%    Weight:   72.9 kg (160 lb 11.5 oz)   Height:   6\' 1"  (1.854 m)       Constitutional: Mildly distressed as evidenced by ongoing nausea and vomiting, diaphoretic-having difficulty completing history due to recurrent nausea and vomiting Eyes: PERRL, lids and conjunctivae normal ENMT: Mucous membranes are dry. Posterior pharynx clear of any exudate or lesions.Normal dentition.  Neck: normal, supple, no masses, no thyromegaly Respiratory: clear to auscultation bilaterally, no wheezing, no crackles. Normal respiratory effort. No accessory muscle use.  Cardiovascular: Regular rate and rhythm, no murmurs / rubs / gallops. No extremity edema. 2+ pedal pulses. No carotid bruits.  Abdomen: Tender over epigastrium, no masses palpated. No hepatosplenomegaly. Hypoactive bowel sounds positive.  Musculoskeletal: no clubbing /  cyanosis. No joint deformity upper and lower extremities. Good ROM, no contractures. Normal muscle tone.  Skin: no rashes, lesions, ulcers. No induration-diaphoretic primarily of her scalp in setting of recurrent emesis Neurologic: CN 2-12 grossly intact. Sensation intact, DTR normal. Strength 5/5 x all 4 extremities.  Psychiatric: Normal judgment and insight. Alert and oriented x 3. Normal mood.    Labs on Admission: I have personally reviewed following labs and imaging studies  CBC:  Recent Labs Lab 05/13/16 0334 05/15/16 2323  WBC 7.4 6.8  NEUTROABS 4.7  --   HGB 13.5 13.6  HCT 40.4 41.1  MCV 75.0* 74.9*  PLT 265 269   Basic Metabolic Panel:  Recent Labs Lab 05/13/16 0334 05/15/16 2323  NA 136 133*  K 3.9 3.8  CL 98* 98*  CO2 31 27  GLUCOSE 184* 147*  BUN 10 11  CREATININE 1.00 1.20  CALCIUM  10.2 9.3   GFR: Estimated Creatinine Clearance: 73.4 mL/min (by C-G formula based on SCr of 1.2 mg/dL). Liver Function Tests:  Recent Labs Lab 05/13/16 0334 05/15/16 2323  AST 16 19  ALT 18 19  ALKPHOS 54 51  BILITOT 0.4 0.5  PROT 6.8 6.9  ALBUMIN 4.0 4.1    Recent Labs Lab 05/13/16 0334 05/15/16 2323  LIPASE 79* 132*   No results for input(s): AMMONIA in the last 168 hours. Coagulation Profile: No results for input(s): INR, PROTIME in the last 168 hours. Cardiac Enzymes: No results for input(s): CKTOTAL, CKMB, CKMBINDEX, TROPONINI in the last 168 hours. BNP (last 3 results) No results for input(s): PROBNP in the last 8760 hours. HbA1C: No results for input(s): HGBA1C in the last 72 hours. CBG:  Recent Labs Lab 05/16/16 0758  GLUCAP 205*   Lipid Profile: No results for input(s): CHOL, HDL, LDLCALC, TRIG, CHOLHDL, LDLDIRECT in the last 72 hours. Thyroid Function Tests: No results for input(s): TSH, T4TOTAL, FREET4, T3FREE, THYROIDAB in the last 72 hours. Anemia Panel: No results for input(s): VITAMINB12, FOLATE, FERRITIN, TIBC, IRON, RETICCTPCT in the last 72 hours. Urine analysis:    Component Value Date/Time   COLORURINE YELLOW 05/15/2016 2324   APPEARANCEUR CLEAR 05/15/2016 2324   LABSPEC <1.005 (L) 05/15/2016 2324   PHURINE 5.5 05/15/2016 2324   GLUCOSEU NEGATIVE 05/15/2016 2324   HGBUR TRACE (A) 05/15/2016 2324   BILIRUBINUR NEGATIVE 05/15/2016 2324   KETONESUR NEGATIVE 05/15/2016 2324   PROTEINUR NEGATIVE 05/15/2016 2324   UROBILINOGEN 0.2 09/05/2010 0945   NITRITE NEGATIVE 05/15/2016 2324   LEUKOCYTESUR NEGATIVE 05/15/2016 2324   Sepsis Labs: @LABRCNTIP (procalcitonin:4,lacticidven:4) )No results found for this or any previous visit (from the past 240 hour(s)).   Radiological Exams on Admission: Dg Abd Portable 1v  Result Date: 05/16/2016 CLINICAL DATA:  Abdominal pain, vomiting for a few days EXAM: PORTABLE ABDOMEN - 1 VIEW COMPARISON:   None. FINDINGS: The bowel gas pattern is normal. No radio-opaque calculi or other significant radiographic abnormality are seen. IMPRESSION: Negative. Electronically Signed   By: Elige KoHetal  Patel   On: 05/16/2016 08:03    EKG: (Independently reviewed) Sinus rhythm with ventricular rate 69 bpm, QTC 45 ms, elevated J point in lateral leads, normal R-wave rotation  Assessment/Plan Principal Problem:   Pancreatitis -Patient presents with epigastric abdominal pain and intractable nausea and vomiting with mild elevation in lipase of 132 -Reports pancreatitis dating back to 2012 -Previous abdominal ultrasound in June without gallstones and current LFTs are normal -Previous lipid panel with normal triglycerides -Previous CT abdomen and pelvis without evidence of  pancreatitis in June and currently without left upper quadrant abdominal pain -Patient on ACE inhibitor and this has been stopped -Patient has underlying diabetes therefore nausea and vomiting could be more reflective of diabetic gastroparesis with resultant mild elevations in lipase secondary to intractable nausea and vomiting -NPO and IV fluids -IV Pepcid every 12 hours -Repeat lipase in a.m. -Symptom management with scheduled Toradol and prn IV morphine for pain -Scheduled Zofran and Reglan with prn Phenergan for nausea and vomiting  Active Problems:   Dehydration with hyponatremia -IV fluids as above    Constipation -Mild to moderate stool burden right colon -Once can tolerate POs will need to begin stool softeners and may need laxative    Insulin dependent type 2 diabetes mellitus, uncontrolled  -Holding preadmission Glucovance and Tradjenta -Continue SSI -Hemoglobin A1c    Essential hypertension -Holding ACE inhibitor as above -IV hydralazine prn with parameters -Control pain    Anemia -Baseline hemoglobin 11.3-12.6 -Current hemoglobin 13.5    Diabetic neuropathy/Chronic pain syndrome -Patient reports takes medications  for pain at home -Medication reconciliation reveals oxycodone IR 5 mg every 6 hours prn; was apparently prescribed Norco by EDP on 12/9 but this was not listed in medication reconciliation -Patient reports intermittent use of marijuana to assist in management of pain but denies daily use therefore at this juncture do not suspect marijuana and his cyclic vomiting as reason for current GI symptoms -Consider the addition of either Neurontin or Lyrica as opposed to utilization of chronic narcotics      DVT prophylaxis: Lovenox  Code Status: Full Family Communication: No family at bedside Disposition Plan: Anticipate discharge back to preadmission home environment when medically stable Consults called: None     ELLIS,ALLISON L. ANP-BC [facility] Pager [phone]   If 7PM-7AM, please contact night-coverage [email or web address] Password [facility]  05/16/2016, 8:55 AM

## 2016-05-16 NOTE — Progress Notes (Signed)
NURSING PROGRESS NOTE  [name] HildingGregory [name] [id number] Admission Data: 05/16/2016 8:14 AM Attending Provider: Hillary BowJared M Gardner, DO YQI:[id number]:[name],[name] S, MD Code Status: FULL   [name] [name] is a 55 y.o. male patient admitted from ED:  -No acute distress noted.  -No complaints of shortness of breath.  -No complaints of chest pain.   Cardiac Monitoring: N/A  Blood pressure (!) 149/94, pulse 62, temperature 98 F (36.7 C), temperature source Oral, resp. rate 16, height 6\' 1"  (1.854 m), weight 72.9 kg (160 lb 11.5 oz), SpO2 100 %.   IV Fluids:  IV in place, occlusive dsg intact without redness, IV cath forearm left, condition patent and no redness normal saline.   Allergies:  Bee venom  Past Medical History:   has a past medical history of Diabetes mellitus without complication (HCC); Essential hypertension; Pancreatitis; and Paroxysmal atrial fibrillation (HCC).  Past Surgical History:   has no past surgical history on file.  Social History:   reports that he has been smoking Cigarettes.  He has been smoking about 0.50 packs per day. He has never used smokeless tobacco. He reports that he drinks alcohol. He reports that he does not use drugs.  Skin: WNL  Patient/Family orientated to room. Information packet given to patient/family. Admission inpatient armband information verified with patient/family to include name and date of birth and placed on patient arm. Side rails up x 2, fall assessment and education completed with patient/family. Patient/family able to verbalize understanding of risk associated with falls and verbalized understanding to call for assistance before getting out of bed. Call light within reach. Patient/family able to voice and demonstrate understanding of unit orientation instructions.    Will continue to evaluate and treat per MD orders.  Bennie Pieriniyndi Veronia Laprise, RN

## 2016-05-16 NOTE — Progress Notes (Signed)
Inpatient Diabetes Program Recommendations  AACE/ADA: New Consensus Statement on Inpatient Glycemic Control (2015)  Target Ranges:  Prepandial:   less than 140 mg/dL      Peak postprandial:   less than 180 mg/dL (1-2 hours)      Critically ill patients:  140 - 180 mg/dL   Results for Orlin HildingMCBRIDE, Que (MRN [id number]) as of 05/16/2016 15:12  Ref. Range 05/16/2016 07:58 05/16/2016 11:44  Glucose-Capillary Latest Ref Range: 65 - 99 mg/dL 409205 (H) 811260 (H)    Admit with: Pancreatitis  History: DM  Home DM Meds: Glucovance 5/500 BID       Tradjenta 5 mg daily       Basaglar (insulin glargine) 0-45 units BID  Current Insulin Orders: Novolog Sensitive Correction Scale/ SSI (0-9 units) Q4 hours      MD- Please consider starting a portion of pt's home dose of basal insulin:  Recommend Lantus 22 units daily (0.3 units/kg dosing based in weight of 73 kg)     --Will follow patient during hospitalization--  Ambrose FinlandJeannine Johnston Elizah Mierzwa RN, MSN, CDE Diabetes Coordinator Inpatient Glycemic Control Team Team Pager: [phone] (8a-5p)

## 2016-05-17 DIAGNOSIS — E1143 Type 2 diabetes mellitus with diabetic autonomic (poly)neuropathy: Secondary | ICD-10-CM | POA: Diagnosis present

## 2016-05-17 DIAGNOSIS — E118 Type 2 diabetes mellitus with unspecified complications: Secondary | ICD-10-CM

## 2016-05-17 DIAGNOSIS — Z79891 Long term (current) use of opiate analgesic: Secondary | ICD-10-CM | POA: Diagnosis not present

## 2016-05-17 DIAGNOSIS — R1010 Upper abdominal pain, unspecified: Secondary | ICD-10-CM | POA: Diagnosis not present

## 2016-05-17 DIAGNOSIS — G894 Chronic pain syndrome: Secondary | ICD-10-CM | POA: Diagnosis present

## 2016-05-17 DIAGNOSIS — D509 Iron deficiency anemia, unspecified: Secondary | ICD-10-CM | POA: Diagnosis present

## 2016-05-17 DIAGNOSIS — K3184 Gastroparesis: Secondary | ICD-10-CM | POA: Diagnosis not present

## 2016-05-17 DIAGNOSIS — K208 Other esophagitis: Secondary | ICD-10-CM | POA: Diagnosis not present

## 2016-05-17 DIAGNOSIS — R1013 Epigastric pain: Secondary | ICD-10-CM | POA: Diagnosis present

## 2016-05-17 DIAGNOSIS — K219 Gastro-esophageal reflux disease without esophagitis: Secondary | ICD-10-CM | POA: Diagnosis present

## 2016-05-17 DIAGNOSIS — K2210 Ulcer of esophagus without bleeding: Secondary | ICD-10-CM | POA: Diagnosis present

## 2016-05-17 DIAGNOSIS — G43A1 Cyclical vomiting, intractable: Secondary | ICD-10-CM | POA: Diagnosis present

## 2016-05-17 DIAGNOSIS — I10 Essential (primary) hypertension: Secondary | ICD-10-CM | POA: Diagnosis not present

## 2016-05-17 DIAGNOSIS — R1110 Vomiting, unspecified: Secondary | ICD-10-CM | POA: Diagnosis not present

## 2016-05-17 DIAGNOSIS — E871 Hypo-osmolality and hyponatremia: Secondary | ICD-10-CM | POA: Diagnosis present

## 2016-05-17 DIAGNOSIS — D649 Anemia, unspecified: Secondary | ICD-10-CM | POA: Diagnosis not present

## 2016-05-17 DIAGNOSIS — Z794 Long term (current) use of insulin: Secondary | ICD-10-CM | POA: Diagnosis not present

## 2016-05-17 DIAGNOSIS — F17210 Nicotine dependence, cigarettes, uncomplicated: Secondary | ICD-10-CM | POA: Diagnosis present

## 2016-05-17 DIAGNOSIS — K5900 Constipation, unspecified: Secondary | ICD-10-CM | POA: Diagnosis present

## 2016-05-17 DIAGNOSIS — R1115 Cyclical vomiting syndrome unrelated to migraine: Secondary | ICD-10-CM

## 2016-05-17 DIAGNOSIS — Z23 Encounter for immunization: Secondary | ICD-10-CM | POA: Diagnosis not present

## 2016-05-17 DIAGNOSIS — G8929 Other chronic pain: Secondary | ICD-10-CM

## 2016-05-17 DIAGNOSIS — I480 Paroxysmal atrial fibrillation: Secondary | ICD-10-CM | POA: Diagnosis present

## 2016-05-17 DIAGNOSIS — E860 Dehydration: Secondary | ICD-10-CM | POA: Diagnosis present

## 2016-05-17 DIAGNOSIS — Z825 Family history of asthma and other chronic lower respiratory diseases: Secondary | ICD-10-CM | POA: Diagnosis not present

## 2016-05-17 DIAGNOSIS — K3189 Other diseases of stomach and duodenum: Secondary | ICD-10-CM | POA: Diagnosis not present

## 2016-05-17 DIAGNOSIS — Z833 Family history of diabetes mellitus: Secondary | ICD-10-CM | POA: Diagnosis not present

## 2016-05-17 DIAGNOSIS — E1165 Type 2 diabetes mellitus with hyperglycemia: Secondary | ICD-10-CM | POA: Diagnosis not present

## 2016-05-17 LAB — LIPASE, BLOOD: LIPASE: 18 U/L (ref 11–51)

## 2016-05-17 LAB — GLUCOSE, CAPILLARY
GLUCOSE-CAPILLARY: 194 mg/dL — AB (ref 65–99)
GLUCOSE-CAPILLARY: 171 mg/dL — AB (ref 65–99)
Glucose-Capillary: 145 mg/dL — ABNORMAL HIGH (ref 65–99)
Glucose-Capillary: 172 mg/dL — ABNORMAL HIGH (ref 65–99)
Glucose-Capillary: 174 mg/dL — ABNORMAL HIGH (ref 65–99)
Glucose-Capillary: 206 mg/dL — ABNORMAL HIGH (ref 65–99)

## 2016-05-17 LAB — COMPREHENSIVE METABOLIC PANEL
ALT: 20 U/L (ref 17–63)
AST: 18 U/L (ref 15–41)
Albumin: 3.8 g/dL (ref 3.5–5.0)
Alkaline Phosphatase: 51 U/L (ref 38–126)
Anion gap: 16 — ABNORMAL HIGH (ref 5–15)
BILIRUBIN TOTAL: 0.3 mg/dL (ref 0.3–1.2)
BUN: 20 mg/dL (ref 6–20)
CHLORIDE: 106 mmol/L (ref 101–111)
CO2: 18 mmol/L — ABNORMAL LOW (ref 22–32)
CREATININE: 1.3 mg/dL — AB (ref 0.61–1.24)
Calcium: 9.2 mg/dL (ref 8.9–10.3)
Glucose, Bld: 175 mg/dL — ABNORMAL HIGH (ref 65–99)
POTASSIUM: 4.5 mmol/L (ref 3.5–5.1)
Sodium: 140 mmol/L (ref 135–145)
TOTAL PROTEIN: 6.6 g/dL (ref 6.5–8.1)

## 2016-05-17 LAB — CBC
HCT: 39 % (ref 39.0–52.0)
Hemoglobin: 12.4 g/dL — ABNORMAL LOW (ref 13.0–17.0)
MCH: 23.7 pg — ABNORMAL LOW (ref 26.0–34.0)
MCHC: 31.8 g/dL (ref 30.0–36.0)
MCV: 74.6 fL — AB (ref 78.0–100.0)
PLATELETS: 247 10*3/uL (ref 150–400)
RBC: 5.23 MIL/uL (ref 4.22–5.81)
RDW: 14.1 % (ref 11.5–15.5)
WBC: 7.8 10*3/uL (ref 4.0–10.5)

## 2016-05-17 LAB — HEMOGLOBIN A1C
HEMOGLOBIN A1C: 11.2 % — AB (ref 4.8–5.6)
Mean Plasma Glucose: 275 mg/dL

## 2016-05-17 MED ORDER — METOCLOPRAMIDE HCL 5 MG/ML IJ SOLN
5.0000 mg | Freq: Once | INTRAMUSCULAR | Status: AC
  Administered 2016-05-17: 5 mg via INTRAVENOUS
  Filled 2016-05-17: qty 2

## 2016-05-17 MED ORDER — ONDANSETRON HCL 4 MG/2ML IJ SOLN
4.0000 mg | Freq: Four times a day (QID) | INTRAMUSCULAR | Status: DC | PRN
  Administered 2016-05-17 – 2016-05-18 (×2): 4 mg via INTRAVENOUS
  Filled 2016-05-17 (×2): qty 2

## 2016-05-17 MED ORDER — METOCLOPRAMIDE HCL 5 MG/ML IJ SOLN
10.0000 mg | Freq: Four times a day (QID) | INTRAMUSCULAR | Status: DC
  Administered 2016-05-17: 10 mg via INTRAVENOUS
  Filled 2016-05-17: qty 2

## 2016-05-17 MED ORDER — METOCLOPRAMIDE HCL 5 MG/ML IJ SOLN
10.0000 mg | Freq: Four times a day (QID) | INTRAMUSCULAR | Status: DC
  Administered 2016-05-17 – 2016-05-19 (×8): 10 mg via INTRAVENOUS
  Filled 2016-05-17 (×8): qty 2

## 2016-05-17 NOTE — Progress Notes (Signed)
[facility] PROGRESS NOTE  Orlin HildingGregory Potier RUE:[id number] DOB: [birth year] DOA: 05/16/2016  PCP: Laurena SlimmerLARK,PRESTON S, MD  Brief History/Interval Summary: 55 year old male with past medical history of diabetes, hypertension, chronic neuropathic pain and anemia presented with abdominal pain, nausea and vomiting. Patient had similar symptoms back in June. He was found to have mildly elevated lipase level then and even now. CT scan done at that time did not show any evidence for pancreatitis. Concern was for either pancreatitis or diabetic gastroparesis. Patient was hospitalized for further management.  Reason for Visit: Nausea and vomiting  Consultants: Gastroenterology  Procedures: None yet  Antibiotics: None  Subjective/Interval History: Patient continues to have upper abdominal pain. He has been nauseated. Did not have any vomiting the last few hours. Denies any diarrhea.  ROS: Denies any chest pain or shortness of breath  Objective:  Vital Signs  Vitals:   05/16/16 2147 05/17/16 0503 05/17/16 0906 05/17/16 1423  BP: (!) 143/69 (!) 191/80 (!) 148/69 (!) 160/69  Pulse: 73 77 63 61  Resp: 18 19  18   Temp: 98.5 F (36.9 C) 97.5 F (36.4 C) 98.2 F (36.8 C) 99 F (37.2 C)  TempSrc: Oral Oral Oral Oral  SpO2: 100% 100% 100% 100%  Weight:      Height:        Intake/Output Summary (Last 24 hours) at 05/17/16 1546 Last data filed at 05/17/16 1516  Gross per 24 hour  Intake          2051.67 ml  Output              350 ml  Net          1701.67 ml   Filed Weights   05/16/16 0759  Weight: 72.9 kg (160 lb 11.5 oz)    General appearance: alert, cooperative, appears stated age and no distress Resp: clear to auscultation bilaterally Cardio: regular rate and rhythm, S1, S2 normal, no murmur, click, rub or gallop GI: Abdomen is soft. Tender in the epigastric area without any rebound, rigidity or guarding. No masses or organomegaly. Bowel sounds are present. Neurologic: No  focal deficits.  Lab Results:  Data Reviewed: I have personally reviewed following labs and imaging studies  CBC:  Recent Labs Lab 05/13/16 0334 05/15/16 2323 05/17/16 0407  WBC 7.4 6.8 7.8  NEUTROABS 4.7  --   --   HGB 13.5 13.6 12.4*  HCT 40.4 41.1 39.0  MCV 75.0* 74.9* 74.6*  PLT 265 269 247    Basic Metabolic Panel:  Recent Labs Lab 05/13/16 0334 05/15/16 2323 05/16/16 1010 05/17/16 0407  NA 136 133*  --  140  K 3.9 3.8  --  4.5  CL 98* 98*  --  106  CO2 31 27  --  18*  GLUCOSE 184* 147*  --  175*  BUN 10 11  --  20  CREATININE 1.00 1.20  --  1.30*  CALCIUM 10.2 9.3  --  9.2  MG  --   --  1.4*  --   PHOS  --   --  3.7  --     GFR: Estimated Creatinine Clearance: 67.8 mL/min (by C-G formula based on SCr of 1.3 mg/dL (H)).  Liver Function Tests:  Recent Labs Lab 05/13/16 0334 05/15/16 2323 05/17/16 0407  AST 16 19 18   ALT 18 19 20   ALKPHOS 54 51 51  BILITOT 0.4 0.5 0.3  PROT 6.8 6.9 6.6  ALBUMIN 4.0 4.1 3.8     Recent  Labs Lab 05/13/16 0334 05/15/16 2323 05/17/16 1116  LIPASE 79* 132* 18   HbA1C:  Recent Labs  05/16/16 0714  HGBA1C 11.2*    CBG:  Recent Labs Lab 05/16/16 2030 05/17/16 0022 05/17/16 0502 05/17/16 0831 05/17/16 1211  GLUCAP 219* 206* 194* 174* 171*     Radiology Studies: Dg Abd Portable 1v  Result Date: 05/16/2016 CLINICAL DATA:  Abdominal pain, vomiting for a few days EXAM: PORTABLE ABDOMEN - 1 VIEW COMPARISON:  None. FINDINGS: The bowel gas pattern is normal. No radio-opaque calculi or other significant radiographic abnormality are seen. IMPRESSION: Negative. Electronically Signed   By: Elige KoHetal  Patel   On: 05/16/2016 08:03     Medications:  Scheduled: . enoxaparin (LOVENOX) injection  40 mg Subcutaneous Q24H  . famotidine (PEPCID) IV  20 mg Intravenous Q12H  . insulin aspart  0-9 Units Subcutaneous Q4H  . metoCLOPramide (REGLAN) injection  10 mg Intravenous Q6H   Continuous: . sodium chloride 100  mL/hr at 05/17/16 0857   ZOX:[id number]:acetaminophen **OR** acetaminophen, hydrALAZINE, morphine injection, ondansetron (ZOFRAN) IV, [DISCONTINUED] ondansetron (ZOFRAN) IV **OR** promethazine  Assessment/Plan:  Principal Problem:   Pancreatitis Active Problems:   Insulin dependent type 2 diabetes mellitus, uncontrolled (HCC)   Essential hypertension   Dehydration with hyponatremia   Anemia   Constipation   Diabetic neuropathy (HCC)   Chronic pain syndrome   Diabetes mellitus with complication (HCC)   Gastroesophageal reflux disease without esophagitis    Upper abdominal pain with nausea and vomiting Etiology remains unclear. Patient was noted to have slightly elevated lipase level. Pancreatitis is a possibility. Other differentials include diabetic gastroparesis, gastritis, PUD. Patient had similar symptoms back in June. Patient does not drink alcohol on a regular basis. He drinks one or 2 beers on the weekends. Smokes marijuana but not on a daily basis. Lipase is normal today. Lisinopril has been held as it can rarely cause pancreatitis. Triglyceride levels were normal in June. In view of diagnostic uncertainty we will go ahead and consult gastroenterology. Continue Pepcid. Reglan.  Dehydration with hyponatremia Improved. Continue IV fluids for now.  Constipation Mild to moderate stool burden right colon. Once can tolerate POs will need to begin stool softeners and may need laxative.  Insulin dependent type 2 diabetes mellitus, uncontrolled  Holding preadmission Glucovance and Tradjenta. Patient also noted to be on long-acting insulin at home. Continue SSI. Hemoglobin A1c is 11.2.  Essential hypertension Holding ACE inhibitor as above. IV hydralazine prn with parameters.   Microcytic Anemia Baseline hemoglobin 11.3-12.6. Anemia panel checked back in June showed ferritin of 107. TIBC 321.  Diabetic neuropathy/Chronic pain syndrome Patient reports takes medications for pain at home.  Patient reports intermittent use of marijuana to assist in management of pain but denies daily use therefore at this juncture do not suspect marijuana as reason for current GI symptoms.   DVT Prophylaxis: Lovenox    Code Status: Full code  Family Communication: Discussed with the patient and his parents  Disposition Plan: Management as outlined above.    LOS: 0 days   [facility] Endoscopy CenterKRISHNAN,Ahnesti Townsend  [facility] Pager [phone] 05/17/2016, 3:46 PM  If 7PM-7AM, please contact night-coverage at [email or web address], password [email or web address]

## 2016-05-17 NOTE — Consult Note (Signed)
Referring Provider: Dr. Osvaldo Shipper Primary Care Physician:  Laurena Slimmer, MD Primary Gastroenterologist:  [facility]  Reason for Consultation:  Nausea, vomiting, and abdominal pain  HPI: [name] [name] is a 55 y.o. male with medical history significant for diabetes mellitus on both oral agents and short acting insulin, hypertension, tobacco abuse, and chronic neuropathic pain. Patient reports that for the past 5-6 days he's had issues with nausea and vomiting and upper abdominal pain without diarrhea. He's actually not even moved his bowels for several days but has not been able to keep anything down, especially food.  Patient was initially seen in the ER on 12/9 for the same symptoms and was given a prescription of Zofran and Norco to utilize. Unfortunately his symptoms have not improved and have actually worsened. It was presumed he had a mild case of pancreatitis noting lipase was 79. On this admission his lipase was 132. Patient has a history of being hospitalized in June what was called acute pancreatitis with lipase up to 203, but abdominal ultrasound and CT abdomen and pelvis unremarkable and no evidence of pancreatitis. Patient reports that since previous hospitalization he has had milder recurrences of GI symptoms with the most recent episode after Thanksgiving of this year that was not severe enough to warrant presentation to a physician for evaluation. Of note in the past (and currently) LFTs have been normal. His triglycerides have been normal as well. Patient reports issues with severe chronic pain in his legs related to his diabetes for which he takes pain medications. He admits to occasional use of marijuana to supplement his pain medications but denies regular use of marijuana.  GI called to evaluate regarding these issues.  Lipase is 18 today.  Hgb A1C is 11.2.  He was on lisinopril for HTN and that has been held.  He is currently on pepcid 20 mg BID, Zofran ATC, and phenergan  prn.  He is NPO.  Reports still nausea and dry heaves with upper abdominal pain.  Was not on any acid medications at home.     Past Medical History:  Diagnosis Date  . Essential hypertension   . Family history of adverse reaction to anesthesia    "mom stopped breathing for awhile; shortness of breath" (05/16/2016)  . Pancreatitis 2012; 05/16/2016  . Paroxysmal atrial fibrillation (HCC)    2012 with pancreatitis, resolved quickly  . Type II diabetes mellitus (HCC)     Past Surgical History:  Procedure Laterality Date  . NO PAST SURGERIES      Prior to Admission medications   Medication Sig Start Date End Date Taking? Authorizing Provider  glyBURIDE-metformin (GLUCOVANCE) 5-500 MG per tablet Take 1 tablet by mouth 2 (two) times daily with a meal.   Yes Historical Provider, MD  Insulin Glargine (BASAGLAR KWIKPEN) 100 UNIT/ML SOPN Inject 0-45 Units into the skin 2 (two) times daily. Sliding scale   Yes Historical Provider, MD  linagliptin (TRADJENTA) 5 MG TABS tablet Take 5 mg by mouth daily.   Yes Historical Provider, MD  lisinopril (PRINIVIL,ZESTRIL) 5 MG tablet Take 5 mg by mouth daily.   Yes Historical Provider, MD  ondansetron (ZOFRAN ODT) 8 MG disintegrating tablet Take 1 tablet (8 mg total) by mouth every 8 (eight) hours as needed. Patient taking differently: Take 8 mg by mouth every 8 (eight) hours as needed for nausea or vomiting.  05/13/16  Yes Zadie Rhine, MD  oxyCODONE (ROXICODONE) 5 MG immediate release tablet Take 1 tablet (5 mg total) by mouth  every 6 (six) hours as needed for severe pain. 05/13/16  Yes Zadie Rhineonald Wickline, MD    Current Facility-Administered Medications  Medication Dose Route Frequency Provider Last Rate Last Dose  . 0.9 %  sodium chloride infusion   Intravenous Continuous Russella DarAllison L Ellis, NP 100 mL/hr at 05/17/16 0857    . acetaminophen (TYLENOL) tablet 650 mg  650 mg Oral Q6H PRN Russella DarAllison L Ellis, NP       Or  . acetaminophen (TYLENOL) suppository 650 mg   650 mg Rectal Q6H PRN Russella DarAllison L Ellis, NP      . enoxaparin (LOVENOX) injection 40 mg  40 mg Subcutaneous Q24H Russella DarAllison L Ellis, NP   40 mg at 05/17/16 0914  . famotidine (PEPCID) IVPB 20 mg premix  20 mg Intravenous Q12H Russella DarAllison L Ellis, NP   20 mg at 05/17/16 0909  . hydrALAZINE (APRESOLINE) injection 10 mg  10 mg Intravenous Q4H PRN Russella DarAllison L Ellis, NP      . insulin aspart (novoLOG) injection 0-9 Units  0-9 Units Subcutaneous Q4H Russella DarAllison L Ellis, NP   2 Units at 05/17/16 [id number]  . morphine 2 MG/ML injection 1-4 mg  1-4 mg Intravenous Q2H PRN Russella DarAllison L Ellis, NP   2 mg at 05/17/16 0910  . ondansetron (ZOFRAN) injection 4 mg  4 mg Intravenous Q6H Russella DarAllison L Ellis, NP   4 mg at 05/17/16 0522  . promethazine (PHENERGAN) injection 25 mg  25 mg Intravenous Q6H PRN Russella DarAllison L Ellis, NP   25 mg at 05/17/16 0910    Allergies as of 05/15/2016 - Review Complete 05/15/2016  Allergen Reaction Noted  . Bee venom Swelling 10/29/2013    Family History  Problem Relation Age of Onset  . Diabetes Mother   . Chronic bronchitis Mother   . Lupus Maternal Aunt     Social History   Social History  . Marital status: Married    Spouse name: N/A  . Number of children: N/A  . Years of education: N/A   Occupational History  . Not on file.   Social History Main Topics  . Smoking status: Current Every Day Smoker    Packs/day: 0.50    Years: 35.00    Types: Cigarettes  . Smokeless tobacco: Never Used  . Alcohol use Yes     Comment: 05/16/2016 "a couple beers maybe q other weekend"  . Drug use:     Types: Marijuana     Comment: Not daily-reports utilize this as needed to supplement chronic pain medications  . Sexual activity: Yes   Other Topics Concern  . Not on file   Social History Narrative  . No narrative on file    Review of Systems: ROS is O/W negative except as mentioned in HPI.  Physical Exam: Vital signs in last 24 hours: Temp:  [97.5 F (36.4 C)-98.5 F (36.9 C)] 98.2 F (36.8  C) (12/13 0906) Pulse Rate:  [63-77] 63 (12/13 0906) Resp:  [18-19] 19 (12/13 0503) BP: (143-191)/(69-80) 148/69 (12/13 0906) SpO2:  [100 %] 100 % (12/13 0906) Last BM Date: 05/13/16 General:  Alert, Well-developed, well-nourished, pleasant and cooperative in NAD Head:  Normocephalic and atraumatic. Eyes:  Sclera clear, no icterus.  Conjunctiva pink. Ears:  Normal auditory acuity. Mouth:  No deformity or lesions.   Lungs:  Clear throughout to auscultation.  No wheezes, crackles, or rhonchi.  No increased WOB. Heart:  Regular rate and rhythm; no murmurs, clicks, rubs, or gallops. Abdomen:  Soft, non-distended.  BS  present.  Epigastric TTP. Rectal:  Deferred  Msk:  Symmetrical without gross deformities. Pulses:  Normal pulses noted. Extremities:  Without clubbing or edema. Neurologic:  Alert and oriented x4;  grossly normal neurologically. Skin:  Intact without significant lesions or rashes. Psych:  Alert and cooperative. Normal mood and affect.  Intake/Output from previous day: 12/12 0701 - 12/13 0700 In: 2051.7 [I.V.:1951.7; IV Piggyback:100] Out: 350 [Urine:350]  Lab Results:  Recent Labs  05/15/16 2323 05/17/16 0407  WBC 6.8 7.8  HGB 13.6 12.4*  HCT 41.1 39.0  PLT 269 247   BMET  Recent Labs  05/15/16 2323 05/17/16 0407  NA 133* 140  K 3.8 4.5  CL 98* 106  CO2 27 18*  GLUCOSE 147* 175*  BUN 11 20  CREATININE 1.20 1.30*  CALCIUM 9.3 9.2   LFT  Recent Labs  05/17/16 0407  PROT 6.6  ALBUMIN 3.8  AST 18  ALT 20  ALKPHOS 51  BILITOT 0.3   Studies/Results: Dg Abd Portable 1v  Result Date: 05/16/2016 CLINICAL DATA:  Abdominal pain, vomiting for a few days EXAM: PORTABLE ABDOMEN - 1 VIEW COMPARISON:  None. FINDINGS: The bowel gas pattern is normal. No radio-opaque calculi or other significant radiographic abnormality are seen. IMPRESSION: Negative. Electronically Signed   By: Elige KoHetal  Patel   On: 05/16/2016 08:03   IMPRESSION:  *55 year old male with  diabetes who was admitted for intractable N/V and abdominal pain.  Lipase slightly elevated but has been elevated chronically.  This may be his baseline, but also can be elevated with vomiting.  CT scan in 11/2015 negative for pancreatitis and he had an elevated lipase and abdominal pain at that time as well.  I do not think that this represents pancreatitis, but possibly gastroparesis with acute flare.  There is also some question of possible ACEI induced angioedema so his lisinopril has been held and probably would not hurt to change this, -Diabetes mellitus:  Not well controlled.  On both PO and short-acting insulin.  Hgb A1C 11.2 this admission. -Peripheral neuropathy and chronic pain syndrome:  On opioids for pain.    PLAN: -Need to limit the use pain medication both inpatient and outpatient as this will worsen symptoms. -Needs better control of blood sugars. -Need to consider alternate for pain management at home such as lyrica or gabapentin. -Will place him on Reglan 10 mg IV every 6 hours while he is here to see if this help.  Continue phenergan prn and will change zofran to prn for now as well. -Will allow ice chips for now. -Consider EGD to rule out ulcer, etc that could also have caused some irritation to the pancreas.  Lurlie Wigen D.  05/17/2016, 10:22 AM  Pager number [phone]

## 2016-05-18 ENCOUNTER — Encounter (HOSPITAL_COMMUNITY)
Admission: EM | Disposition: A | Discharge status: Home / Self Care | Payer: Self-pay | Source: Home / Self Care | Attending: Internal Medicine

## 2016-05-18 ENCOUNTER — Encounter (HOSPITAL_COMMUNITY): Payer: Self-pay

## 2016-05-18 DIAGNOSIS — Z794 Long term (current) use of insulin: Secondary | ICD-10-CM

## 2016-05-18 DIAGNOSIS — E1165 Type 2 diabetes mellitus with hyperglycemia: Secondary | ICD-10-CM

## 2016-05-18 DIAGNOSIS — K3189 Other diseases of stomach and duodenum: Secondary | ICD-10-CM

## 2016-05-18 HISTORY — PX: ESOPHAGOGASTRODUODENOSCOPY (EGD) WITH PROPOFOL: SHX5813

## 2016-05-18 LAB — CBC
HCT: 38 % — ABNORMAL LOW (ref 39.0–52.0)
HEMOGLOBIN: 12.1 g/dL — AB (ref 13.0–17.0)
MCH: 23.9 pg — ABNORMAL LOW (ref 26.0–34.0)
MCHC: 31.8 g/dL (ref 30.0–36.0)
MCV: 75 fL — ABNORMAL LOW (ref 78.0–100.0)
PLATELETS: 219 10*3/uL (ref 150–400)
RBC: 5.07 MIL/uL (ref 4.22–5.81)
RDW: 14.3 % (ref 11.5–15.5)
WBC: 5.9 10*3/uL (ref 4.0–10.5)

## 2016-05-18 LAB — COMPREHENSIVE METABOLIC PANEL
ALBUMIN: 3.5 g/dL (ref 3.5–5.0)
ALK PHOS: 47 U/L (ref 38–126)
ALT: 20 U/L (ref 17–63)
ANION GAP: 10 (ref 5–15)
AST: 15 U/L (ref 15–41)
BUN: 13 mg/dL (ref 6–20)
CHLORIDE: 102 mmol/L (ref 101–111)
CO2: 26 mmol/L (ref 22–32)
Calcium: 8.8 mg/dL — ABNORMAL LOW (ref 8.9–10.3)
Creatinine, Ser: 1.13 mg/dL (ref 0.61–1.24)
GFR calc Af Amer: >60 mL/min (ref >60)
GFR calc non Af Amer: >60 mL/min (ref >60)
GLUCOSE: 196 mg/dL — AB (ref 65–99)
POTASSIUM: 4 mmol/L (ref 3.5–5.1)
SODIUM: 138 mmol/L (ref 135–145)
Total Bilirubin: 0.5 mg/dL (ref 0.3–1.2)
Total Protein: 5.9 g/dL — ABNORMAL LOW (ref 6.5–8.1)

## 2016-05-18 LAB — GLUCOSE, CAPILLARY
GLUCOSE-CAPILLARY: 185 mg/dL — AB (ref 65–99)
Glucose-Capillary: 146 mg/dL — ABNORMAL HIGH (ref 65–99)
Glucose-Capillary: 183 mg/dL — ABNORMAL HIGH (ref 65–99)
Glucose-Capillary: 183 mg/dL — ABNORMAL HIGH (ref 65–99)
Glucose-Capillary: 185 mg/dL — ABNORMAL HIGH (ref 65–99)
Glucose-Capillary: 194 mg/dL — ABNORMAL HIGH (ref 65–99)

## 2016-05-18 SURGERY — ESOPHAGOGASTRODUODENOSCOPY (EGD) WITH PROPOFOL
Anesthesia: Moderate Sedation

## 2016-05-18 MED ORDER — FENTANYL CITRATE (PF) 100 MCG/2ML IJ SOLN
INTRAMUSCULAR | Status: AC
  Filled 2016-05-18: qty 2

## 2016-05-18 MED ORDER — DIPHENHYDRAMINE HCL 50 MG/ML IJ SOLN
25.0000 mg | Freq: Once | INTRAMUSCULAR | Status: AC
  Administered 2016-05-18: 25 mg via INTRAVENOUS

## 2016-05-18 MED ORDER — SODIUM CHLORIDE 0.9 % IV SOLN
INTRAVENOUS | Status: DC
  Administered 2016-05-18: 15:00:00 via INTRAVENOUS

## 2016-05-18 MED ORDER — MIDAZOLAM HCL 5 MG/ML IJ SOLN
INTRAMUSCULAR | Status: AC
  Filled 2016-05-18: qty 2

## 2016-05-18 MED ORDER — DIPHENHYDRAMINE HCL 50 MG/ML IJ SOLN
INTRAMUSCULAR | Status: AC
  Filled 2016-05-18: qty 1

## 2016-05-18 MED ORDER — MIDAZOLAM HCL 10 MG/2ML IJ SOLN
INTRAMUSCULAR | Status: DC | PRN
  Administered 2016-05-18: 1 mg via INTRAVENOUS
  Administered 2016-05-18: 2 mg via INTRAVENOUS
  Administered 2016-05-18: 1 mg via INTRAVENOUS

## 2016-05-18 MED ORDER — BUTAMBEN-TETRACAINE-BENZOCAINE 2-2-14 % EX AERO
INHALATION_SPRAY | CUTANEOUS | Status: DC | PRN
  Administered 2016-05-18: 2 via TOPICAL

## 2016-05-18 MED ORDER — FENTANYL CITRATE (PF) 100 MCG/2ML IJ SOLN
INTRAMUSCULAR | Status: DC | PRN
  Administered 2016-05-18: 50 ug via INTRAVENOUS
  Administered 2016-05-18: 25 ug via INTRAVENOUS

## 2016-05-18 NOTE — Op Note (Signed)
[facility] Patient Name: [name] [name] Procedure Date : 05/18/2016 MRN: [id number] Attending MD: Napoleon FormKavitha V. Nandigam , MD Date of Birth: [birth year] CSN: [id number] Age: [age] Admit Type: Inpatient Procedure:                Upper GI endoscopy Indications:              Persistent vomiting of unknown cause, Epigastric                            abdominal pain Providers:                Napoleon FormKavitha V. Nandigam, MD, Roselie AwkwardShannon Love, RN, Clearnce SorrelKatie                            Smith, Technician Referring MD:              Medicines:                Fentanyl 75 micrograms IV, Midazolam 4 mg IV,                            Diphenhydramine 25 mg IV Complications:            No immediate complications. Estimated Blood Loss:     Estimated blood loss was minimal. Procedure:                Pre-Anesthesia Assessment:                           - Prior to the procedure, a History and Physical                            was performed, and patient medications and                            allergies were reviewed. The patient's tolerance of                            previous anesthesia was also reviewed. The risks                            and benefits of the procedure and the sedation                            options and risks were discussed with the patient.                            All questions were answered, and informed consent                            was obtained. Prior Anticoagulants: The patient has                            taken no previous anticoagulant or antiplatelet  agents. ASA Grade Assessment: III - A patient with                            severe systemic disease. After reviewing the risks                            and benefits, the patient was deemed in                            satisfactory condition to undergo the procedure.                           After obtaining informed consent, the endoscope was                            passed under  direct vision. Throughout the                            procedure, the patient's blood pressure, pulse, and                            oxygen saturations were monitored continuously. The                            EG-[id number] ([id number]) scope was introduced through the                            mouth, and advanced to the second part of duodenum.                            The upper GI endoscopy was accomplished without                            difficulty. The patient tolerated the procedure                            well. Scope In: Scope Out: Findings:      Mildly severe esophagitis with no bleeding was found 30 to 40 cm from       the incisors likely in the setting of recurrent vomiting      No gross lesions were noted in the entire examined stomach.      Diffuse mildly scalloped mucosa was found in the duodenal bulb and in       the second portion of the duodenum. Biopsies for histology were taken       with a cold forceps for evaluation of celiac disease. Impression:               - Mildly severe erosive esophagitis.                           - No gross lesions in the stomach.                           - Scalloped mucosa was found in the duodenum, rule  out celiac disease. Biopsied. Moderate Sedation:      Moderate (conscious) sedation was administered by the endoscopy nurse       and supervised by the endoscopist. The following parameters were       monitored: oxygen saturation, heart rate, blood pressure, and response       to care. Total physician intraservice time was 13 minutes. Recommendation:           - Patient has a contact number available for                            emergencies. The signs and symptoms of potential                            delayed complications were discussed with the                            patient. Return to normal activities tomorrow.                            Written discharge instructions were provided to the                             patient.                           - Resume previous diet.                           - Continue present medications.                           - PPI once daily                           - Small frequent meals, avoid high fiber and fat                            diet                           - Maintain steady glycemic state, avoid hyper or                            hypoglycemic events                           - Await pathology results.                           - No repeat upper endoscopy.                           - Follow up in GI office as outpatient                           -Will sign off, please call with any questions Procedure Code(s):        ---  Professional ---                           [phone], Esophagogastroduodenoscopy, flexible,                            transoral; with biopsy, single or multiple                           G0500, Moderate sedation services provided by the                            same physician or other qualified health care                            professional performing a gastrointestinal                            endoscopic service that sedation supports,                            requiring the presence of an independent trained                            observer to assist in the monitoring of the                            patient's level of consciousness and physiological                            status; initial 15 minutes of intra-service time;                            patient age 16 years or older (additional time may                            be reported with [id number], as appropriate) Diagnosis Code(s):        --- Professional ---                           K20.8, Other esophagitis                           K31.89, Other diseases of stomach and duodenum                           R11.10, Vomiting, unspecified                           R10.13, Epigastric pain CPT copyright 2016 [employer]. All rights  reserved. The codes documented in this report are preliminary and upon coder review may  be revised to meet current compliance requirements. Napoleon FormKavitha V. Nandigam, MD 05/18/2016 3:26:11 PM This report has been signed electronically. Number of Addenda: 0

## 2016-05-18 NOTE — Interval H&P Note (Signed)
History and Physical Interval Note:  05/18/2016 2:49 PM  [name] [name]  has presented today for surgery, with the diagnosis of Nausea, vomiting, abdominal pain  The various methods of treatment have been discussed with the patient and family. After consideration of risks, benefits and other options for treatment, the patient has consented to  Procedure(s): ESOPHAGOGASTRODUODENOSCOPY (EGD) WITH PROPOFOL (N/A) as a surgical intervention .  The patient's history has been reviewed, patient examined, no change in status, stable for surgery.  I have reviewed the patient's chart and labs.  Questions were answered to the patient's satisfaction.     [name]

## 2016-05-18 NOTE — Care Management Note (Signed)
Case Management Note [facility] Patient Details  Name: Orlin HildingGregory Hermans MRN: [id number] Date of Birth: [birth year]  Subjective/Objective:            Admitted with pancreatitis, hx of diabetes mellitus, hypertension, tobacco abuse, and chronic neuropathic. From home with wife. Independent with ADL's PTA and no DME usage.  Loreli DollarJoi Muellner (Spouse)      [phone]      PCP: Margaretmary BayleyPreston Clark  Action/Plan: Plan ERCP today(12/14) , ? d/c toay after procedure.  Expected Discharge Date:   05/18/2016             Expected Discharge Plan:  Home/Self Care  In-House Referral:     Discharge planning Services  CM Consult  Post Acute Care Choice:    Choice offered to:     DME Arranged:    DME Agency:     HH Arranged:    HH Agency:     Status of Service:  Completed, signed off  If discussed at [employer] Length of Stay Meetings, dates discussed:    Additional Comments:  Epifanio LeschesCole, Renessa Wellnitz Hudson, RN 05/18/2016, 9:53 AM

## 2016-05-18 NOTE — Progress Notes (Signed)
[facility] PROGRESS NOTE  [name] HildingGregory Norkus WJX:[id number] DOB: 1961/03/24 DOA: 05/16/2016  PCP: Laurena SlimmerLARK,PRESTON S, MD  Brief History/Interval Summary: 55 year old male with past medical history of diabetes, hypertension, chronic neuropathic pain and anemia presented with abdominal pain, nausea and vomiting. Patient had similar symptoms back in June. He was found to have mildly elevated lipase level then and even now. CT scan done at that time did not show any evidence for pancreatitis. Concern was for either pancreatitis or diabetic gastroparesis. Patient was hospitalized for further management.  Reason for Visit: Nausea and vomiting  Consultants: Gastroenterology  Procedures: EGD is planned during this hospitalization  Antibiotics: None  Subjective/Interval History: Patient has not had any episodes of vomiting, although he hasn't been nauseated. He continues to have some pain in his upper abdomen. Denies any diarrhea.  ROS: Denies any chest pain or shortness of breath  Objective:  Vital Signs  Vitals:   05/17/16 0906 05/17/16 1423 05/17/16 2134 05/18/16 0449  BP: (!) 148/69 (!) 160/69 (!) 160/85 (!) 174/83  Pulse: 63 61 71 60  Resp:  18 18 18   Temp: 98.2 F (36.8 C) 99 F (37.2 C) 98.8 F (37.1 C) 98.2 F (36.8 C)  TempSrc: Oral Oral Oral Oral  SpO2: 100% 100% 100% 100%  Weight:      Height:        Intake/Output Summary (Last 24 hours) at 05/18/16 0901 Last data filed at 05/18/16 0700  Gross per 24 hour  Intake             2800 ml  Output                0 ml  Net             2800 ml   Filed Weights   05/16/16 0759  Weight: 72.9 kg (160 lb 11.5 oz)    General appearance: alert, cooperative, appears stated age and no distress Resp: clear to auscultation bilaterally Cardio: regular rate and rhythm, S1, S2 normal, no murmur, click, rub or gallop GI: Abdomen is soft. Slightly Tender in the epigastric area without any rebound, rigidity or guarding. No  masses or organomegaly. Bowel sounds are present. Neurologic: No focal deficits.  Lab Results:  Data Reviewed: I have personally reviewed following labs and imaging studies  CBC:  Recent Labs Lab 05/13/16 0334 05/15/16 2323 05/17/16 0407 05/18/16 0349  WBC 7.4 6.8 7.8 5.9  NEUTROABS 4.7  --   --   --   HGB 13.5 13.6 12.4* 12.1*  HCT 40.4 41.1 39.0 38.0*  MCV 75.0* 74.9* 74.6* 75.0*  PLT 265 269 247 219    Basic Metabolic Panel:  Recent Labs Lab 05/13/16 0334 05/15/16 2323 05/16/16 1010 05/17/16 0407 05/18/16 0349  NA 136 133*  --  140 138  K 3.9 3.8  --  4.5 4.0  CL 98* 98*  --  106 102  CO2 31 27  --  18* 26  GLUCOSE 184* 147*  --  175* 196*  BUN 10 11  --  20 13  CREATININE 1.00 1.20  --  1.30* 1.13  CALCIUM 10.2 9.3  --  9.2 8.8*  MG  --   --  1.4*  --   --   PHOS  --   --  3.7  --   --     GFR: Estimated Creatinine Clearance: 78 mL/min (by C-G formula based on SCr of 1.13 mg/dL).  Liver Function Tests:  Recent Labs Lab  05/13/16 0334 05/15/16 2323 05/17/16 0407 05/18/16 0349  AST 16 19 18 15   ALT 18 19 20 20   ALKPHOS 54 51 51 47  BILITOT 0.4 0.5 0.3 0.5  PROT 6.8 6.9 6.6 5.9*  ALBUMIN 4.0 4.1 3.8 3.5     Recent Labs Lab 05/13/16 0334 05/15/16 2323 05/17/16 1116  LIPASE 79* 132* 18   HbA1C:  Recent Labs  05/16/16 0714  HGBA1C 11.2*    CBG:  Recent Labs Lab 05/17/16 1623 05/17/16 2012 05/18/16 0011 05/18/16 0445 05/18/16 0747  GLUCAP 172* 145* 185* 185* 183*     Radiology Studies: No results found.   Medications:  Scheduled: . enoxaparin (LOVENOX) injection  40 mg Subcutaneous Q24H  . famotidine (PEPCID) IV  20 mg Intravenous Q12H  . insulin aspart  0-9 Units Subcutaneous Q4H  . metoCLOPramide (REGLAN) injection  10 mg Intravenous Q6H   Continuous: . sodium chloride 100 mL/hr at 05/18/16 0555   XBJ:[id number]:acetaminophen **OR** acetaminophen, hydrALAZINE, morphine injection, ondansetron (ZOFRAN) IV, [DISCONTINUED]  ondansetron (ZOFRAN) IV **OR** promethazine  Assessment/Plan:  Principal Problem:   Pancreatitis Active Problems:   Insulin dependent type 2 diabetes mellitus, uncontrolled (HCC)   Essential hypertension   Dehydration with hyponatremia   Anemia   Constipation   Diabetic neuropathy (HCC)   Chronic pain syndrome   Diabetes mellitus with complication (HCC)   Gastroesophageal reflux disease without esophagitis   Epigastric abdominal pain   Intractable cyclical vomiting with nausea    Upper abdominal pain with nausea and vomiting Etiology remains unclear. Lipase was mildly elevated and now normal. Pancreatitis is a possibility. Other differentials include diabetic gastroparesis, gastritis, PUD. Patient had similar symptoms back in June. Patient does not drink alcohol on a regular basis. He drinks one or 2 beers on the weekends. Smokes marijuana but not on a daily basis. Lisinopril has been held as it can rarely cause pancreatitis. Triglyceride levels were normal in June. Seen by gastroenterology. Appreciated input. Plan is for EGD. Continue Pepcid. Reglan.  Dehydration with hyponatremia Improved. Continue IV fluids for now at a lower rate.  Constipation Mild to moderate stool burden right colon. Once can tolerate POs will need to begin stool softeners and may need laxative.  Insulin dependent type 2 diabetes mellitus, uncontrolled  Holding preadmission Glucovance and Tradjenta. Patient also noted to be on long-acting insulin at home. Continue SSI. Hemoglobin A1c is 11.2.  Essential hypertension Holding ACE inhibitor as above. IV hydralazine prn with parameters.   Microcytic Anemia Baseline hemoglobin 11.3-12.6. Anemia panel checked back in June showed ferritin of 107. TIBC 321.  Diabetic neuropathy/Chronic pain syndrome Patient reports takes medications for pain at home. Patient reports intermittent use of marijuana to assist in management of pain but denies daily use.   DVT  Prophylaxis: Lovenox    Code Status: Full code  Family Communication: Discussed with the patient Disposition Plan: Management as outlined above.    LOS: 1 day   Paul[facility] County HospitalKRISHNAN,Lorane Cousar  [facility] Pager [phone] 05/18/2016, 9:01 AM  If 7PM-7AM, please contact night-coverage at [email or web address], password [email or web address]

## 2016-05-18 NOTE — H&P (View-Only) (Signed)
Referring Provider: Dr. Osvaldo Shipper Primary Care Physician:  Laurena Slimmer, MD Primary Gastroenterologist:  Volney American  Reason for Consultation:  Nausea, vomiting, and abdominal pain  HPI: [name] [name] is a 55 y.o. male with medical history significant for diabetes mellitus on both oral agents and short acting insulin, hypertension, tobacco abuse, and chronic neuropathic pain. Patient reports that for the past 5-6 days he's had issues with nausea and vomiting and upper abdominal pain without diarrhea. He's actually not even moved his bowels for several days but has not been able to keep anything down, especially food.  Patient was initially seen in the ER on 12/9 for the same symptoms and was given a prescription of Zofran and Norco to utilize. Unfortunately his symptoms have not improved and have actually worsened. It was presumed he had a mild case of pancreatitis noting lipase was 79. On this admission his lipase was 132. Patient has a history of being hospitalized in June what was called acute pancreatitis with lipase up to 203, but abdominal ultrasound and CT abdomen and pelvis unremarkable and no evidence of pancreatitis. Patient reports that since previous hospitalization he has had milder recurrences of GI symptoms with the most recent episode after Thanksgiving of this year that was not severe enough to warrant presentation to a physician for evaluation. Of note in the past (and currently) LFTs have been normal. His triglycerides have been normal as well. Patient reports issues with severe chronic pain in his legs related to his diabetes for which he takes pain medications. He admits to occasional use of marijuana to supplement his pain medications but denies regular use of marijuana.  GI called to evaluate regarding these issues.  Lipase is 18 today.  Hgb A1C is 11.2.  He was on lisinopril for HTN and that has been held.  He is currently on pepcid 20 mg BID, Zofran ATC, and phenergan  prn.  He is NPO.  Reports still nausea and dry heaves with upper abdominal pain.  Was not on any acid medications at home.     Past Medical History:  Diagnosis Date  . Essential hypertension   . Family history of adverse reaction to anesthesia    "mom stopped breathing for awhile; shortness of breath" (05/16/2016)  . Pancreatitis 2012; 05/16/2016  . Paroxysmal atrial fibrillation (HCC)    2012 with pancreatitis, resolved quickly  . Type II diabetes mellitus (HCC)     Past Surgical History:  Procedure Laterality Date  . NO PAST SURGERIES      Prior to Admission medications   Medication Sig Start Date End Date Taking? Authorizing Provider  glyBURIDE-metformin (GLUCOVANCE) 5-500 MG per tablet Take 1 tablet by mouth 2 (two) times daily with a meal.   Yes Historical Provider, MD  Insulin Glargine (BASAGLAR KWIKPEN) 100 UNIT/ML SOPN Inject 0-45 Units into the skin 2 (two) times daily. Sliding scale   Yes Historical Provider, MD  linagliptin (TRADJENTA) 5 MG TABS tablet Take 5 mg by mouth daily.   Yes Historical Provider, MD  lisinopril (PRINIVIL,ZESTRIL) 5 MG tablet Take 5 mg by mouth daily.   Yes Historical Provider, MD  ondansetron (ZOFRAN ODT) 8 MG disintegrating tablet Take 1 tablet (8 mg total) by mouth every 8 (eight) hours as needed. Patient taking differently: Take 8 mg by mouth every 8 (eight) hours as needed for nausea or vomiting.  05/13/16  Yes Zadie Rhine, MD  oxyCODONE (ROXICODONE) 5 MG immediate release tablet Take 1 tablet (5 mg total) by mouth  every 6 (six) hours as needed for severe pain. 05/13/16  Yes Zadie Rhineonald Wickline, MD    Current Facility-Administered Medications  Medication Dose Route Frequency Provider Last Rate Last Dose  . 0.9 %  sodium chloride infusion   Intravenous Continuous Russella DarAllison L Ellis, NP 100 mL/hr at 05/17/16 0857    . acetaminophen (TYLENOL) tablet 650 mg  650 mg Oral Q6H PRN Russella DarAllison L Ellis, NP       Or  . acetaminophen (TYLENOL) suppository 650 mg   650 mg Rectal Q6H PRN Russella DarAllison L Ellis, NP      . enoxaparin (LOVENOX) injection 40 mg  40 mg Subcutaneous Q24H Russella DarAllison L Ellis, NP   40 mg at 05/17/16 0914  . famotidine (PEPCID) IVPB 20 mg premix  20 mg Intravenous Q12H Russella DarAllison L Ellis, NP   20 mg at 05/17/16 0909  . hydrALAZINE (APRESOLINE) injection 10 mg  10 mg Intravenous Q4H PRN Russella DarAllison L Ellis, NP      . insulin aspart (novoLOG) injection 0-9 Units  0-9 Units Subcutaneous Q4H Russella DarAllison L Ellis, NP   2 Units at 05/17/16 [phone] [id number]  . morphine 2 MG/ML injection 1-4 mg  1-4 mg Intravenous Q2H PRN Russella DarAllison L Ellis, NP   2 mg at 05/17/16 0910  . ondansetron (ZOFRAN) injection 4 mg  4 mg Intravenous Q6H Russella DarAllison L Ellis, NP   4 mg at 05/17/16 0522  . promethazine (PHENERGAN) injection 25 mg  25 mg Intravenous Q6H PRN Russella DarAllison L Ellis, NP   25 mg at 05/17/16 0910    Allergies as of 05/15/2016 - Review Complete 05/15/2016  Allergen Reaction Noted  . Bee venom Swelling 10/29/2013    Family History  Problem Relation Age of Onset  . Diabetes Mother   . Chronic bronchitis Mother   . Lupus Maternal Aunt     Social History   Social History  . Marital status: Married    Spouse name: N/A  . Number of children: N/A  . Years of education: N/A   Occupational History  . Not on file.   Social History Main Topics  . Smoking status: Current Every Day Smoker    Packs/day: 0.50    Years: 35.00    Types: Cigarettes  . Smokeless tobacco: Never Used  . Alcohol use Yes     Comment: 05/16/2016 "a couple beers maybe q other weekend"  . Drug use:     Types: Marijuana     Comment: Not daily-reports utilize this as needed to supplement chronic pain medications  . Sexual activity: Yes   Other Topics Concern  . Not on file   Social History Narrative  . No narrative on file    Review of Systems: ROS is O/W negative except as mentioned in HPI.  Physical Exam: Vital signs in last 24 hours: Temp:  [97.5 F (36.4 C)-98.5 F (36.9 C)] 98.2 F (36.8  C) (12/13 0906) Pulse Rate:  [63-77] 63 (12/13 0906) Resp:  [18-19] 19 (12/13 0503) BP: (143-191)/(69-80) 148/69 (12/13 0906) SpO2:  [100 %] 100 % (12/13 0906) Last BM Date: 05/13/16 General:  Alert, Well-developed, well-nourished, pleasant and cooperative in NAD Head:  Normocephalic and atraumatic. Eyes:  Sclera clear, no icterus.  Conjunctiva pink. Ears:  Normal auditory acuity. Mouth:  No deformity or lesions.   Lungs:  Clear throughout to auscultation.  No wheezes, crackles, or rhonchi.  No increased WOB. Heart:  Regular rate and rhythm; no murmurs, clicks, rubs, or gallops. Abdomen:  Soft, non-distended.  BS  present.  Epigastric TTP. Rectal:  Deferred  Msk:  Symmetrical without gross deformities. Pulses:  Normal pulses noted. Extremities:  Without clubbing or edema. Neurologic:  Alert and oriented x4;  grossly normal neurologically. Skin:  Intact without significant lesions or rashes. Psych:  Alert and cooperative. Normal mood and affect.  Intake/Output from previous day: 12/12 0701 - 12/13 0700 In: 2051.7 [I.V.:1951.7; IV Piggyback:100] Out: 350 [Urine:350]  Lab Results:  Recent Labs  05/15/16 2323 05/17/16 0407  WBC 6.8 7.8  HGB 13.6 12.4*  HCT 41.1 39.0  PLT 269 247   BMET  Recent Labs  05/15/16 2323 05/17/16 0407  NA 133* 140  K 3.8 4.5  CL 98* 106  CO2 27 18*  GLUCOSE 147* 175*  BUN 11 20  CREATININE 1.20 1.30*  CALCIUM 9.3 9.2   LFT  Recent Labs  05/17/16 0407  PROT 6.6  ALBUMIN 3.8  AST 18  ALT 20  ALKPHOS 51  BILITOT 0.3   Studies/Results: Dg Abd Portable 1v  Result Date: 05/16/2016 CLINICAL DATA:  Abdominal pain, vomiting for a few days EXAM: PORTABLE ABDOMEN - 1 VIEW COMPARISON:  None. FINDINGS: The bowel gas pattern is normal. No radio-opaque calculi or other significant radiographic abnormality are seen. IMPRESSION: Negative. Electronically Signed   By: Elige KoHetal  Patel   On: 05/16/2016 08:03   IMPRESSION:  *55 year old male with  diabetes who was admitted for intractable N/V and abdominal pain.  Lipase slightly elevated but has been elevated chronically.  This may be his baseline, but also can be elevated with vomiting.  CT scan in 11/2015 negative for pancreatitis and he had an elevated lipase and abdominal pain at that time as well.  I do not think that this represents pancreatitis, but possibly gastroparesis with acute flare.  There is also some question of possible ACEI induced angioedema so his lisinopril has been held and probably would not hurt to change this, -Diabetes mellitus:  Not well controlled.  On both PO and short-acting insulin.  Hgb A1C 11.2 this admission. -Peripheral neuropathy and chronic pain syndrome:  On opioids for pain.    PLAN: -Need to limit the use pain medication both inpatient and outpatient as this will worsen symptoms. -Needs better control of blood sugars. -Need to consider alternate for pain management at home such as lyrica or gabapentin. -Will place him on Reglan 10 mg IV every 6 hours while he is here to see if this help.  Continue phenergan prn and will change zofran to prn for now as well. -Will allow ice chips for now. -Consider EGD to rule out ulcer, etc that could also have caused some irritation to the pancreas.  Rajveer Handler D.  05/17/2016, 10:22 AM  Pager number [phone]

## 2016-05-19 ENCOUNTER — Other Ambulatory Visit: Payer: Self-pay

## 2016-05-19 DIAGNOSIS — R197 Diarrhea, unspecified: Secondary | ICD-10-CM

## 2016-05-19 DIAGNOSIS — K3184 Gastroparesis: Secondary | ICD-10-CM

## 2016-05-19 DIAGNOSIS — E1143 Type 2 diabetes mellitus with diabetic autonomic (poly)neuropathy: Principal | ICD-10-CM

## 2016-05-19 LAB — BASIC METABOLIC PANEL
Anion gap: 10 (ref 5–15)
BUN: 11 mg/dL (ref 6–20)
CHLORIDE: 99 mmol/L — AB (ref 101–111)
CO2: 28 mmol/L (ref 22–32)
CREATININE: 0.99 mg/dL (ref 0.61–1.24)
Calcium: 9 mg/dL (ref 8.9–10.3)
GFR calc Af Amer: >60 mL/min (ref >60)
GLUCOSE: 154 mg/dL — AB (ref 65–99)
Potassium: 3.6 mmol/L (ref 3.5–5.1)
SODIUM: 137 mmol/L (ref 135–145)

## 2016-05-19 LAB — CBC
HCT: 39.2 % (ref 39.0–52.0)
Hemoglobin: 12.7 g/dL — ABNORMAL LOW (ref 13.0–17.0)
MCH: 24 pg — AB (ref 26.0–34.0)
MCHC: 32.4 g/dL (ref 30.0–36.0)
MCV: 74.1 fL — AB (ref 78.0–100.0)
PLATELETS: 219 10*3/uL (ref 150–400)
RBC: 5.29 MIL/uL (ref 4.22–5.81)
RDW: 13.8 % (ref 11.5–15.5)
WBC: 6.8 10*3/uL (ref 4.0–10.5)

## 2016-05-19 LAB — GLUCOSE, CAPILLARY
GLUCOSE-CAPILLARY: 174 mg/dL — AB (ref 65–99)
GLUCOSE-CAPILLARY: 187 mg/dL — AB (ref 65–99)
Glucose-Capillary: 160 mg/dL — ABNORMAL HIGH (ref 65–99)
Glucose-Capillary: 158 mg/dL — ABNORMAL HIGH (ref 65–99)

## 2016-05-19 LAB — MAGNESIUM: Magnesium: 1.4 mg/dL — ABNORMAL LOW (ref 1.7–2.4)

## 2016-05-19 MED ORDER — OXYCODONE HCL 5 MG PO TABS: 5.0000 mg | ORAL_TABLET | Freq: Four times a day (QID) | ORAL | 0 refills | Status: DC | PRN

## 2016-05-19 MED ORDER — OXYCODONE-ACETAMINOPHEN 5-325 MG PO TABS: 1.0000 | ORAL_TABLET | ORAL | Status: DC | PRN

## 2016-05-19 MED ORDER — PANTOPRAZOLE SODIUM 40 MG PO TBEC: 40.0000 mg | DELAYED_RELEASE_TABLET | Freq: Two times a day (BID) | ORAL | 1 refills | Status: DC

## 2016-05-19 MED ORDER — METOCLOPRAMIDE HCL 10 MG PO TABS
10.0000 mg | ORAL_TABLET | Freq: Three times a day (TID) | ORAL | Status: DC
  Administered 2016-05-19: 10 mg via ORAL
  Filled 2016-05-19: qty 1

## 2016-05-19 MED ORDER — POLYETHYLENE GLYCOL 3350 17 G PO PACK: 17.0000 g | PACK | Freq: Every day | ORAL | 0 refills | Status: AC

## 2016-05-19 MED ORDER — PANTOPRAZOLE SODIUM 40 MG PO TBEC
40.0000 mg | DELAYED_RELEASE_TABLET | Freq: Two times a day (BID) | ORAL | Status: DC
Start: 2016-05-19 — End: 2016-05-19
  Administered 2016-05-19: 40 mg via ORAL
  Filled 2016-05-19: qty 1

## 2016-05-19 MED ORDER — METOCLOPRAMIDE HCL 10 MG PO TABS: 10.0000 mg | ORAL_TABLET | Freq: Three times a day (TID) | ORAL | 1 refills | Status: DC

## 2016-05-19 NOTE — Discharge Summary (Signed)
[facility]  Physician Discharge Summary   Patient ID: [name] [name] MRN: [id number] DOB/AGE: 53-17-1962 53 y.o.  Admit date: 05/16/2016 Discharge date: 05/19/2016  PCP: Laurena SlimmerLARK,PRESTON S, MD  DISCHARGE DIAGNOSES:  Principal Problem:   Pancreatitis Active Problems:   Insulin dependent type 2 diabetes mellitus, uncontrolled (HCC)   Essential hypertension   Dehydration with hyponatremia   Anemia   Constipation   Diabetic neuropathy (HCC)   Chronic pain syndrome   Diabetes mellitus with complication (HCC)   Gastroesophageal reflux disease without esophagitis   Abdominal pain, chronic, epigastric   Intractable cyclical vomiting with nausea   RECOMMENDATIONS FOR OUTPATIENT FOLLOW UP: 1. Outpatient follow-up with primary care provider for diabetes management 2. Biopsies taken during EGD will need to be followed up on.   DISCHARGE CONDITION: fair  Diet recommendation: Modified carbohydrate in the form of small meals with fiber for his gastroparesis  Filed Weights   05/16/16 0759  Weight: 72.9 kg (160 lb 11.5 oz)    INITIAL HISTORY: 55 year old male with past medical history of diabetes, hypertension, chronic neuropathic pain and anemia presented with abdominal pain, nausea and vomiting. Patient had similar symptoms back in June. He was found to have mildly elevated lipase level then and even now. CT scan done at that time did not show any evidence for pancreatitis. Concern was for either pancreatitis or diabetic gastroparesis. Patient was hospitalized for further management.  Consultations:  Gastroenterology  Procedures: EGD - Mildly severe erosive esophagitis. - No gross lesions in the stomach. - Scalloped mucosa was found in the duodenum, rule out celiac disease. Biopsied.  HOSPITAL COURSE:   Diabetic gastroparesis Patient presented with nausea, vomiting and upper abdominal pain. He was found to have mildly elevated lipase level. He had a similar  presentation back in June. CT scan of then at that time did not show any abnormalities in the pancreas. Due to his history of diabetes, so there was a concern for diabetic gastroparesis. GI was consulted. Patient underwent endoscopy which showed a mildly severe erosive esophagitis without any lesions in the stomach. No evidence for any obstruction. Scalloped mucosa was noted in the duodenum, which has been biopsied. It is felt that his symptoms are most likely due to diabetic gastroparesis. He was started on Reglan. He has improved. He has tolerated his meals without any difficulty. He will be discharged on PPI as well as Reglan. He's been told to avoid drinking alcohol as well as stop illicit drug use.   Dehydration with hyponatremia Improved with IV hydration.  Constipation Once can tolerate POswill need to begin stool softeners and may need laxative. MiraLAX.  Insulin dependent type 2 diabetes mellitus, uncontrolled  Hemoglobin A1c is 11.2. This was discussed with the patient. Patient is on a long-acting insulin at home. He was asked to discuss his diabetes management with his primary care provider. For now, he may resume his home medications as before.  Essential hypertension 8. Resume home medications  Microcytic Anemia Baseline hemoglobin 11.3-12.6. Anemia panel checked back in June showed ferritin of 107. TIBC 321. Albumin has been stable.  Diabetic neuropathy/Chronic pain syndrome Patient reports takes medications for pain at home. Patient reports intermittent use of marijuana to assist in management of pain but denies daily use.  Overall, patient is stable. He is tolerated his diet. Okay for discharge home today.   PERTINENT LABS:  The results of significant diagnostics from this hospitalization (including imaging, microbiology, ancillary and laboratory) are listed below for reference.  Labs: Basic Metabolic Panel:  Recent Labs Lab 05/13/16 0334 05/15/16 2323  05/16/16 1010 05/17/16 0407 05/18/16 0349 05/19/16 0326  NA 136 133*  --  140 138 137  K 3.9 3.8  --  4.5 4.0 3.6  CL 98* 98*  --  106 102 99*  CO2 31 27  --  18* 26 28  GLUCOSE 184* 147*  --  175* 196* 154*  BUN 10 11  --  20 13 11   CREATININE 1.00 1.20  --  1.30* 1.13 0.99  CALCIUM 10.2 9.3  --  9.2 8.8* 9.0  MG  --   --  1.4*  --   --  1.4*  PHOS  --   --  3.7  --   --   --    Liver Function Tests:  Recent Labs Lab 05/13/16 0334 05/15/16 2323 05/17/16 0407 05/18/16 0349  AST 16 19 18 15   ALT 18 19 20 20   ALKPHOS 54 51 51 47  BILITOT 0.4 0.5 0.3 0.5  PROT 6.8 6.9 6.6 5.9*  ALBUMIN 4.0 4.1 3.8 3.5    Recent Labs Lab 05/13/16 0334 05/15/16 2323 05/17/16 1116  LIPASE 79* 132* 18   CBC:  Recent Labs Lab 05/13/16 0334 05/15/16 2323 05/17/16 0407 05/18/16 0349 05/19/16 0326  WBC 7.4 6.8 7.8 5.9 6.8  NEUTROABS 4.7  --   --   --   --   HGB 13.5 13.6 12.4* 12.1* 12.7*  HCT 40.4 41.1 39.0 38.0* 39.2  MCV 75.0* 74.9* 74.6* 75.0* 74.1*  PLT 265 269 247 219 219   CBG:  Recent Labs Lab 05/18/16 2047 05/19/16 0038 05/19/16 0423 05/19/16 0803 05/19/16 1219  GLUCAP 194* 174* 160* 158* 187*     IMAGING STUDIES Dg Abd Portable 1v  Result Date: 05/16/2016 CLINICAL DATA:  Abdominal pain, vomiting for a few days EXAM: PORTABLE ABDOMEN - 1 VIEW COMPARISON:  None. FINDINGS: The bowel gas pattern is normal. No radio-opaque calculi or other significant radiographic abnormality are seen. IMPRESSION: Negative. Electronically Signed   By: Elige Ko   On: 05/16/2016 08:03    DISCHARGE EXAMINATION: Vitals:   05/18/16 2131 05/18/16 2156 05/19/16 0511 05/19/16 1306  BP: (!) 185/83 (!) 176/90 (!) 168/81 (!) 147/71  Pulse: 68 68 62 68  Resp: 16  17 16   Temp: 98.5 F (36.9 C)  98.4 F (36.9 C) 98 F (36.7 C)  TempSrc: Oral  Oral Oral  SpO2: 100%  100% 100%  Weight:      Height:       General appearance: alert, cooperative, appears stated age and no  distress Resp: clear to auscultation bilaterally Cardio: regular rate and rhythm, S1, S2 normal, no murmur, click, rub or gallop GI: Abdomen remains soft. Mildly tender in the epigastric area but much improved compared to before. No rebound, rigidity or guarding. No masses or organomegaly  DISPOSITION: Home with family  Discharge Instructions    Call MD for:  difficulty breathing, headache or visual disturbances    Complete by:  As directed    Call MD for:  extreme fatigue    Complete by:  As directed    Call MD for:  persistant dizziness or light-headedness    Complete by:  As directed    Call MD for:  persistant nausea and vomiting    Complete by:  As directed    Call MD for:  severe uncontrolled pain    Complete by:  As directed  Call MD for:  temperature >100.4    Complete by:  As directed    Discharge instructions    Complete by:  As directed    Please be sure to follow-up with primary care provider within one week. You will need better management of your diabetes. Your HbA1c is greater than 11. Take your medications as prescribed.  You were cared for by a hospitalist during your hospital stay. If you have any questions about your discharge medications or the care you received while you were in the hospital after you are discharged, you can call the unit and asked to speak with the hospitalist on call if the hospitalist that took care of you is not available. Once you are discharged, your primary care physician will handle any further medical issues. Please note that NO REFILLS for any discharge medications will be authorized once you are discharged, as it is imperative that you return to your primary care physician (or establish a relationship with a primary care physician if you do not have one) for your aftercare needs so that they can reassess your need for medications and monitor your lab values. If you do not have a primary care physician, you can call [phone] for a physician  referral.   Increase activity slowly    Complete by:  As directed       ALLERGIES:  Allergies  Allergen Reactions  . Bee Venom Swelling     Current Discharge Medication List    START taking these medications   Details  metoCLOPramide (REGLAN) 10 MG tablet Take 1 tablet (10 mg total) by mouth 4 (four) times daily -  before meals and at bedtime. Qty: 120 tablet, Refills: 1    pantoprazole (PROTONIX) 40 MG tablet Take 1 tablet (40 mg total) by mouth 2 (two) times daily. Qty: 60 tablet, Refills: 1    polyethylene glycol (MIRALAX) packet Take 17 g by mouth daily. Qty: 14 each, Refills: 0      CONTINUE these medications which have CHANGED   Details  oxyCODONE (ROXICODONE) 5 MG immediate release tablet Take 1 tablet (5 mg total) by mouth every 6 (six) hours as needed for severe pain. Qty: 10 tablet, Refills: 0      CONTINUE these medications which have NOT CHANGED   Details  glyBURIDE-metformin (GLUCOVANCE) 5-500 MG per tablet Take 1 tablet by mouth 2 (two) times daily with a meal.    Insulin Glargine (BASAGLAR KWIKPEN) 100 UNIT/ML SOPN Inject 0-45 Units into the skin 2 (two) times daily. Sliding scale    linagliptin (TRADJENTA) 5 MG TABS tablet Take 5 mg by mouth daily.    lisinopril (PRINIVIL,ZESTRIL) 5 MG tablet Take 5 mg by mouth daily.    ondansetron (ZOFRAN ODT) 8 MG disintegrating tablet Take 1 tablet (8 mg total) by mouth every 8 (eight) hours as needed. Qty: 4 tablet, Refills: 0         Follow-up Information    Laurena SlimmerLARK,PRESTON S, MD. Schedule an appointment as soon as possible for a visit in 1 week(s).   Specialty:  Internal Medicine Contact information: [address] [place] [id number] [phone]           TOTAL DISCHARGE TIME: 35 minutes  [facility] HospitalKRISHNAN,Ridwan Bondy  [facility] Pager [phone]  05/19/2016, 3:40 PM

## 2016-05-19 NOTE — Discharge Instructions (Signed)
° °Gastroparesis °Introduction °Gastroparesis, also called delayed gastric emptying, is a condition in which food takes longer than normal to empty from the stomach. The condition is usually long-lasting (chronic). °What are the causes? °This condition may be caused by: °· An endocrine disorder, such as hypothyroidism or diabetes. Diabetes is the most common cause of this condition. °· A nervous system disease, such as Parkinson disease or multiple sclerosis. °· Cancer, infection, or surgery of the stomach or vagus nerve. °· A connective tissue disorder, such as scleroderma. °· Certain medicines. °In most cases, the cause is not known. °What increases the risk? °This condition is more likely to develop in: °· People with certain disorders, including endocrine disorders, eating disorders, amyloidosis, and scleroderma. °· People with certain diseases, including Parkinson disease or multiple sclerosis. °· People with cancer or infection of the stomach or vagus nerve. °· People who have had surgery on the stomach or vagus nerve. °· People who take certain medicines. °· Women. °What are the signs or symptoms? °Symptoms of this condition include: °· An early feeling of fullness when eating. °· Nausea. °· Weight loss. °· Vomiting. °· Heartburn. °· Abdominal bloating. °· Inconsistent blood glucose levels. °· Lack of appetite. °· Acid from the stomach coming up into the esophagus (gastroesophageal reflux). °· Spasms of the stomach. °Symptoms may come and go. °How is this diagnosed? °This condition is diagnosed with tests, such as: °· Tests that check how long it takes food to move through the stomach and intestines. These tests include: °¨ Upper gastrointestinal (GI) series. In this test, X-rays of the intestines are taken after you drink a liquid. The liquid makes the intestines show up better on the X-rays. °¨ Gastric emptying scintigraphy. In this test, scans are taken after you eat food that contains a small amount of  radioactive material. °¨ Wireless capsule GI monitoring system. This test involves swallowing a capsule that records information about movement through the stomach. °· Gastric manometry. This test measures electrical and muscular activity in the stomach. It is done with a thin tube that is passed down the throat and into the stomach. °· Endoscopy. This test checks for abnormalities in the lining of the stomach. It is done with a long, thin tube that is passed down the throat and into the stomach. °· An ultrasound. This test can help rule out gallbladder disease or pancreatitis as a cause of your symptoms. It uses sound waves to take pictures of the inside of your body. °How is this treated? °There is no cure for gastroparesis. This condition may be managed with: °· Treatment of the underlying condition causing the gastroparesis. °· Lifestyle changes, including exercise and dietary changes. Dietary changes can include: °¨ Changes in what and when you eat. °¨ Eating smaller meals more often. °¨ Eating low-fat foods. °¨ Eating low-fiber forms of high-fiber foods, such as cooked vegetables instead of raw vegetables. °¨ Having liquid foods in place of solid foods. Liquid foods are easier to digest. °· Medicines. These may be given to control nausea and vomiting and to stimulate stomach muscles. °· Getting food through a feeding tube. This may be done in severe cases. °· A gastric neurostimulator. This is a device that is inserted into the body with surgery. It helps improve stomach emptying and control nausea and vomiting. °Follow these instructions at home: °· Follow your health care provider's instructions about exercise and diet. °· Take medicines only as directed by your health care provider. °Contact a health care provider   if: °· Your symptoms do not improve with treatment. °· You have new symptoms. °Get help right away if: °· You have severe abdominal pain that does not improve with treatment. °· You have nausea  that does not go away. °· You cannot keep fluids down. °This information is not intended to replace advice given to you by your health care provider. Make sure you discuss any questions you have with your health care provider. °Document Released: 05/22/2005 Document Revised: 10/28/2015 Document Reviewed: 05/18/2014 °© 2017 Elsevier ° °

## 2016-05-19 NOTE — Progress Notes (Signed)
[name] HildingGregory [name] to be D/C'd Home per MD order.  Discussed with the patient and all questions fully answered.  VSS, Skin clean, dry and intact without evidence of skin break down, no evidence of skin tears noted. IV catheter discontinued intact. Site without signs and symptoms of complications. Dressing and pressure applied.  An After Visit Summary was printed and given to the patient. Patient received prescription.  D/c education completed with patient/family including follow up instructions, medication list, d/c activities limitations if indicated, with other d/c instructions as indicated by MD - patient able to verbalize understanding, all questions fully answered.   Patient instructed to return to ED, call 911, or call MD for any changes in condition.   Patient refused wheelchair and walked to the exit, and D/C home via private auto.  [name] 05/19/2016 3:10 PM

## 2016-05-19 NOTE — Progress Notes (Addendum)
Inpatient Diabetes Program Recommendations  AACE/ADA: New Consensus Statement on Inpatient Glycemic Control (2015)  Target Ranges:  Prepandial:   less than 140 mg/dL      Peak postprandial:   less than 180 mg/dL (1-2 hours)      Critically ill patients:  140 - 180 mg/dL   Lab Results  Component Value Date   GLUCAP 187 (H) 05/19/2016   HGBA1C 11.2 (H) 05/16/2016   Results for [name] [name], [name] (MRN [id number]) as of 05/19/2016 13:14  Ref. Range 05/18/2016 16:18 05/18/2016 20:47 05/19/2016 00:38 05/19/2016 04:23 05/19/2016 08:03 05/19/2016 12:19  Glucose-Capillary Latest Ref Range: 65 - 99 mg/dL 409146 (H)  Novolog 1 unit 194 (H)  Novolog 2 units 174 (H)  Novolog 2 units 160 (H)  Novolog 2 units 158 (H)  Novolog 2 untis 187 (H)  Novolog 2 units   Review of Glycemic Control  Diabetes history: DM, BUN and Creatinine WNL Outpatient Diabetes medications:   Glucovance 5/500 BID  Tradjenta 5 mg daily  Basaglar (insulin glargine) 0-45 units BID Current orders for Inpatient glycemic control:  Novolog Sensitive Correction Scale/ SSI (0-9 units) Q4 hours  Inpatient Diabetes Program Recommendations:  Patient has received a consistent amount of Novolog over the past 24 hours (total = 11 units).  Meal eaten % = 0 today this morning. Patient will benefit from basal insulin of Lantus 10 units daily starting today (which is consistent with current trend of insulin need).  As patient starts to eat, patient may then benefit from meal coverage of Novolog 3 units TIDAC if patient eats > 50% of meal.  Thank you,  Kristine LineaKaren Dabney Schanz, RN, BSN Diabetes Coordinator Inpatient Diabetes Program [phone] (Team Pager)

## 2016-05-22 ENCOUNTER — Encounter (HOSPITAL_COMMUNITY): Payer: Self-pay | Admitting: Gastroenterology

## 2016-05-30 ENCOUNTER — Other Ambulatory Visit: Payer: Self-pay

## 2016-10-19 ENCOUNTER — Encounter (HOSPITAL_COMMUNITY): Payer: Self-pay | Admitting: Emergency Medicine

## 2016-10-19 ENCOUNTER — Inpatient Hospital Stay (HOSPITAL_COMMUNITY)
Admission: EM | Admit: 2016-10-19 | Discharge: 2016-10-21 | DRG: 439 | Disposition: A | Discharge status: Home / Self Care | Payer: 59 | Attending: Internal Medicine | Admitting: Internal Medicine

## 2016-10-19 ENCOUNTER — Observation Stay (HOSPITAL_COMMUNITY): Payer: 59

## 2016-10-19 DIAGNOSIS — K3184 Gastroparesis: Secondary | ICD-10-CM | POA: Diagnosis not present

## 2016-10-19 DIAGNOSIS — R1013 Epigastric pain: Secondary | ICD-10-CM | POA: Diagnosis not present

## 2016-10-19 DIAGNOSIS — I10 Essential (primary) hypertension: Secondary | ICD-10-CM | POA: Diagnosis present

## 2016-10-19 DIAGNOSIS — E1165 Type 2 diabetes mellitus with hyperglycemia: Secondary | ICD-10-CM | POA: Diagnosis present

## 2016-10-19 DIAGNOSIS — K5900 Constipation, unspecified: Secondary | ICD-10-CM | POA: Diagnosis present

## 2016-10-19 DIAGNOSIS — R1115 Cyclical vomiting syndrome unrelated to migraine: Secondary | ICD-10-CM

## 2016-10-19 DIAGNOSIS — I480 Paroxysmal atrial fibrillation: Secondary | ICD-10-CM | POA: Diagnosis present

## 2016-10-19 DIAGNOSIS — E1143 Type 2 diabetes mellitus with diabetic autonomic (poly)neuropathy: Secondary | ICD-10-CM | POA: Diagnosis not present

## 2016-10-19 DIAGNOSIS — Z720 Tobacco use: Secondary | ICD-10-CM | POA: Diagnosis present

## 2016-10-19 DIAGNOSIS — IMO0002 Reserved for concepts with insufficient information to code with codable children: Secondary | ICD-10-CM

## 2016-10-19 DIAGNOSIS — Z794 Long term (current) use of insulin: Secondary | ICD-10-CM

## 2016-10-19 DIAGNOSIS — Z79899 Other long term (current) drug therapy: Secondary | ICD-10-CM

## 2016-10-19 DIAGNOSIS — K8500 Idiopathic acute pancreatitis without necrosis or infection: Secondary | ICD-10-CM

## 2016-10-19 DIAGNOSIS — G43A1 Cyclical vomiting, intractable: Secondary | ICD-10-CM | POA: Diagnosis not present

## 2016-10-19 DIAGNOSIS — F17210 Nicotine dependence, cigarettes, uncomplicated: Secondary | ICD-10-CM | POA: Diagnosis present

## 2016-10-19 DIAGNOSIS — E118 Type 2 diabetes mellitus with unspecified complications: Secondary | ICD-10-CM | POA: Diagnosis present

## 2016-10-19 DIAGNOSIS — E11649 Type 2 diabetes mellitus with hypoglycemia without coma: Secondary | ICD-10-CM | POA: Diagnosis present

## 2016-10-19 DIAGNOSIS — G894 Chronic pain syndrome: Secondary | ICD-10-CM | POA: Diagnosis present

## 2016-10-19 DIAGNOSIS — K859 Acute pancreatitis without necrosis or infection, unspecified: Principal | ICD-10-CM | POA: Diagnosis present

## 2016-10-19 DIAGNOSIS — F1290 Cannabis use, unspecified, uncomplicated: Secondary | ICD-10-CM | POA: Diagnosis present

## 2016-10-19 DIAGNOSIS — K219 Gastro-esophageal reflux disease without esophagitis: Secondary | ICD-10-CM | POA: Diagnosis present

## 2016-10-19 DIAGNOSIS — Z7289 Other problems related to lifestyle: Secondary | ICD-10-CM

## 2016-10-19 DIAGNOSIS — N179 Acute kidney failure, unspecified: Secondary | ICD-10-CM | POA: Diagnosis not present

## 2016-10-19 DIAGNOSIS — Z91030 Bee allergy status: Secondary | ICD-10-CM

## 2016-10-19 DIAGNOSIS — E860 Dehydration: Secondary | ICD-10-CM | POA: Diagnosis present

## 2016-10-19 DIAGNOSIS — G8929 Other chronic pain: Secondary | ICD-10-CM | POA: Diagnosis present

## 2016-10-19 DIAGNOSIS — E1140 Type 2 diabetes mellitus with diabetic neuropathy, unspecified: Secondary | ICD-10-CM | POA: Diagnosis present

## 2016-10-19 HISTORY — DX: Nausea with vomiting, unspecified: R11.2

## 2016-10-19 LAB — COMPREHENSIVE METABOLIC PANEL
ALT: 16 U/L — ABNORMAL LOW (ref 17–63)
ANION GAP: 8 (ref 5–15)
AST: 16 U/L (ref 15–41)
Albumin: 4 g/dL (ref 3.5–5.0)
Alkaline Phosphatase: 47 U/L (ref 38–126)
BILIRUBIN TOTAL: 0.2 mg/dL — AB (ref 0.3–1.2)
BUN: 15 mg/dL (ref 6–20)
CO2: 26 mmol/L (ref 22–32)
Calcium: 9.4 mg/dL (ref 8.9–10.3)
Chloride: 102 mmol/L (ref 101–111)
Creatinine, Ser: 1.25 mg/dL — ABNORMAL HIGH (ref 0.61–1.24)
GFR calc Af Amer: >60 mL/min (ref >60)
Glucose, Bld: 238 mg/dL — ABNORMAL HIGH (ref 65–99)
POTASSIUM: 4.1 mmol/L (ref 3.5–5.1)
Sodium: 136 mmol/L (ref 135–145)
TOTAL PROTEIN: 6.8 g/dL (ref 6.5–8.1)

## 2016-10-19 LAB — RAPID URINE DRUG SCREEN, HOSP PERFORMED
Amphetamines: NOT DETECTED
Barbiturates: NOT DETECTED
Benzodiazepines: NOT DETECTED
Cocaine: NOT DETECTED
OPIATES: NOT DETECTED
TETRAHYDROCANNABINOL: NOT DETECTED

## 2016-10-19 LAB — URINALYSIS, ROUTINE W REFLEX MICROSCOPIC
BACTERIA UA: NONE SEEN
BILIRUBIN URINE: NEGATIVE
Glucose, UA: 50 mg/dL — AB
KETONES UR: NEGATIVE mg/dL
LEUKOCYTES UA: NEGATIVE
NITRITE: NEGATIVE
PH: 5 (ref 5.0–8.0)
Protein, ur: 100 mg/dL — AB
Specific Gravity, Urine: 1.019 (ref 1.005–1.030)
Squamous Epithelial / LPF: NONE SEEN

## 2016-10-19 LAB — CBC
HEMATOCRIT: 38.9 % — AB (ref 39.0–52.0)
HEMOGLOBIN: 12.4 g/dL — AB (ref 13.0–17.0)
MCH: 23.8 pg — ABNORMAL LOW (ref 26.0–34.0)
MCHC: 31.9 g/dL (ref 30.0–36.0)
MCV: 74.8 fL — ABNORMAL LOW (ref 78.0–100.0)
Platelets: 238 10*3/uL (ref 150–400)
RBC: 5.2 MIL/uL (ref 4.22–5.81)
RDW: 15.5 % (ref 11.5–15.5)
WBC: 6 10*3/uL (ref 4.0–10.5)

## 2016-10-19 LAB — LIPID PANEL
Cholesterol: 257 mg/dL — ABNORMAL HIGH (ref 0–200)
HDL: 51 mg/dL (ref >40)
LDL CALC: 180 mg/dL — AB (ref 0–99)
TRIGLYCERIDES: 129 mg/dL (ref <150)
Total CHOL/HDL Ratio: 5 RATIO
VLDL: 26 mg/dL (ref 0–40)

## 2016-10-19 LAB — LIPASE, BLOOD: Lipase: 111 U/L — ABNORMAL HIGH (ref 11–51)

## 2016-10-19 LAB — GLUCOSE, CAPILLARY
GLUCOSE-CAPILLARY: 265 mg/dL — AB (ref 65–99)
GLUCOSE-CAPILLARY: 111 mg/dL — AB (ref 65–99)
GLUCOSE-CAPILLARY: 109 mg/dL — AB (ref 65–99)

## 2016-10-19 LAB — HIV ANTIBODY (ROUTINE TESTING W REFLEX): HIV SCREEN 4TH GENERATION: NONREACTIVE

## 2016-10-19 LAB — CBG MONITORING, ED: Glucose-Capillary: 253 mg/dL — ABNORMAL HIGH (ref 65–99)

## 2016-10-19 MED ORDER — METOCLOPRAMIDE HCL 5 MG/ML IJ SOLN
10.0000 mg | Freq: Four times a day (QID) | INTRAMUSCULAR | Status: AC
  Administered 2016-10-19 – 2016-10-20 (×4): 10 mg via INTRAVENOUS
  Filled 2016-10-19 (×4): qty 2

## 2016-10-19 MED ORDER — ONDANSETRON HCL 4 MG/2ML IJ SOLN
4.0000 mg | INTRAMUSCULAR | Status: AC
Start: 2016-10-19 — End: 2016-10-20
  Administered 2016-10-19 – 2016-10-20 (×5): 4 mg via INTRAVENOUS
  Filled 2016-10-19 (×6): qty 2

## 2016-10-19 MED ORDER — INSULIN ASPART PROT & ASPART (70-30 MIX) 100 UNIT/ML ~~LOC~~ SUSP
20.0000 [IU] | Freq: Every day | SUBCUTANEOUS | Status: DC
  Filled 2016-10-19: qty 10

## 2016-10-19 MED ORDER — ACETAMINOPHEN 650 MG RE SUPP: 650.0000 mg | Freq: Four times a day (QID) | RECTAL | Status: DC | PRN

## 2016-10-19 MED ORDER — SODIUM CHLORIDE 0.9 % IV BOLUS (SEPSIS)
1000.0000 mL | Freq: Once | INTRAVENOUS | Status: AC
  Administered 2016-10-19: 1000 mL via INTRAVENOUS

## 2016-10-19 MED ORDER — HYDRALAZINE HCL 20 MG/ML IJ SOLN: 5.0000 mg | INTRAMUSCULAR | Status: DC | PRN

## 2016-10-19 MED ORDER — FENTANYL CITRATE (PF) 100 MCG/2ML IJ SOLN
50.0000 ug | Freq: Once | INTRAMUSCULAR | Status: AC
  Administered 2016-10-19: 50 ug via INTRAVENOUS
  Filled 2016-10-19: qty 2

## 2016-10-19 MED ORDER — INSULIN ASPART 100 UNIT/ML ~~LOC~~ SOLN: 0.0000 [IU] | Freq: Three times a day (TID) | SUBCUTANEOUS | Status: DC

## 2016-10-19 MED ORDER — INSULIN ASPART 100 UNIT/ML ~~LOC~~ SOLN: 0.0000 [IU] | Freq: Every day | SUBCUTANEOUS | Status: DC

## 2016-10-19 MED ORDER — ONDANSETRON HCL 4 MG/2ML IJ SOLN
4.0000 mg | Freq: Once | INTRAMUSCULAR | Status: AC
  Administered 2016-10-19: 4 mg via INTRAVENOUS
  Filled 2016-10-19: qty 2

## 2016-10-19 MED ORDER — ACETAMINOPHEN 325 MG PO TABS: 650.0000 mg | ORAL_TABLET | Freq: Four times a day (QID) | ORAL | Status: DC | PRN

## 2016-10-19 MED ORDER — INSULIN GLARGINE 100 UNIT/ML ~~LOC~~ SOLN
15.0000 [IU] | Freq: Every day | SUBCUTANEOUS | Status: DC
  Administered 2016-10-19 – 2016-10-20 (×2): 15 [IU] via SUBCUTANEOUS
  Filled 2016-10-19 (×3): qty 0.15

## 2016-10-19 MED ORDER — HYDROMORPHONE HCL 1 MG/ML IJ SOLN
1.0000 mg | INTRAMUSCULAR | Status: DC | PRN
  Administered 2016-10-19 (×2): 1 mg via INTRAVENOUS
  Filled 2016-10-19 (×2): qty 1

## 2016-10-19 MED ORDER — ONDANSETRON HCL 4 MG/2ML IJ SOLN: 4.0000 mg | Freq: Three times a day (TID) | INTRAMUSCULAR | Status: DC | PRN

## 2016-10-19 MED ORDER — PROMETHAZINE HCL 25 MG/ML IJ SOLN: 12.5000 mg | Freq: Four times a day (QID) | INTRAMUSCULAR | Status: DC | PRN

## 2016-10-19 MED ORDER — SODIUM CHLORIDE 0.9 % IV SOLN
INTRAVENOUS | Status: DC
  Administered 2016-10-19: 1000 mL via INTRAVENOUS
  Administered 2016-10-19 – 2016-10-21 (×5): via INTRAVENOUS

## 2016-10-19 MED ORDER — INSULIN ASPART 100 UNIT/ML ~~LOC~~ SOLN
0.0000 [IU] | SUBCUTANEOUS | Status: DC
  Administered 2016-10-19: 8 [IU] via SUBCUTANEOUS
  Administered 2016-10-20 (×2): 2 [IU] via SUBCUTANEOUS
  Administered 2016-10-20: 11 [IU] via SUBCUTANEOUS
  Administered 2016-10-21: 3 [IU] via SUBCUTANEOUS
  Administered 2016-10-21: 2 [IU] via SUBCUTANEOUS
  Administered 2016-10-21: 3 [IU] via SUBCUTANEOUS

## 2016-10-19 MED ORDER — ENOXAPARIN SODIUM 40 MG/0.4ML ~~LOC~~ SOLN
40.0000 mg | SUBCUTANEOUS | Status: DC
  Administered 2016-10-19 – 2016-10-20 (×2): 40 mg via SUBCUTANEOUS
  Filled 2016-10-19 (×2): qty 0.4

## 2016-10-19 MED ORDER — METOCLOPRAMIDE HCL 5 MG/ML IJ SOLN
10.0000 mg | Freq: Once | INTRAMUSCULAR | Status: AC
  Administered 2016-10-19: 10 mg via INTRAVENOUS
  Filled 2016-10-19: qty 2

## 2016-10-19 MED ORDER — INSULIN ASPART PROT & ASPART (70-30 MIX) 100 UNIT/ML ~~LOC~~ SUSP: 12.0000 [IU] | Freq: Every day | SUBCUTANEOUS | Status: DC

## 2016-10-19 NOTE — Care Management Note (Signed)
Case Management Note  Patient Details  Name: [name] HildingGregory [name] MRN: [id number] Date of Birth: 11/24/1960  Subjective/Objective:        Presents with      abdominal pain, intractable nausea and vomiting, and pancreatitis, hx of obese, hypertension, tobacco use, EtOH use, chronic neuropathic pain, anemia, intractable cyclic vomiting with nausea. From home with spouse.  Loreli DollarJoi Delossantos (Spouse) Karoline Caldwelldna Barr (Mother)    [phone]     PCP: Margaretmary BayleyPreston Clark  Action/Plan: Return to home when medically stable. CM to f/u with d/c needs.  Expected Discharge Date:                  Expected Discharge Plan:  Home/Self Care  In-House Referral:     Discharge planning Services  CM Consult  Post Acute Care Choice:    Choice offered to:     DME Arranged:    DME Agency:     HH Arranged:    HH Agency:     Status of Service:  In process, will continue to follow  If discussed at Long Length of Stay Meetings, dates discussed:    Additional Comments:  Epifanio LeschesCole, Jaely Silman Hudson, RN 10/19/2016, 12:43 PM

## 2016-10-19 NOTE — Progress Notes (Signed)
This is a no charge note   Pending admission per Dr. Lynelle DoctorKnapp  56 year old male with past medical history of hypertension, diabetes mellitus, GERD, PAF, pancreatitis, gastroparesis, tobacco abuse, marijuana abuse, drinking alcohol, who presents with nausea, vomiting and abdominal pain since Sunday. No fever or chills. Found to have elevated lipase 111, negative urinalysis, acute renal injury with creatinine 1.25, temperature normal, no tachycardia, oxygen saturation 97% on room air. Patient is accepted to telemetry bed for observation.  Lorretta HarpXilin Shravya Wickwire, MD  [facility] Pager [phone]  If 7PM-7AM, please contact night-coverage [email or web address] Password [email or web address] 10/19/2016, 6:34 AM

## 2016-10-19 NOTE — Progress Notes (Signed)
Inpatient Diabetes Program Recommendations  AACE/ADA: New Consensus Statement on Inpatient Glycemic Control (2015)  Target Ranges:  Prepandial:   less than 140 mg/dL      Peak postprandial:   less than 180 mg/dL (1-2 hours)      Critically ill patients:  140 - 180 mg/dL  Results for [name] [name], [name] (MRN [id number]) as of 10/19/2016 09:34  Ref. Range 10/19/2016 01:06  Glucose-Capillary Latest Ref Range: 65 - 99 mg/dL 213253 (H)   Review of Glycemic Control  Diabetes history: DM2 Outpatient Diabetes medications: Humalog 75/25 20 units QAM, Humalog 75/25 12 units QPM, Glucovance 5-500 mg BID Current orders for Inpatient glycemic control: 70/30 20 units QAM (to start on 10/20/16), 70/30 12 units QPM, Novolog 0-15 units TID with meals, Novolog 0-5 units QHS  Inpatient Diabetes Program Recommendations:  Insulin - Basal: Note patient is being admitted with N/V, pancreatitis and will be NPO. Please discontinue 70/30 since patient is NPO and order Lantus 15 units Q24H (based on 72 kg x 0.2 units). Correction (SSI): Please consider changing frequency of CBGs and Novolog to 0-15 units to Q4H.  Thanks, Orlando PennerMarie Danea Manter, RN, MSN, CDE Diabetes Coordinator Inpatient Diabetes Program [phone] (Team Pager from 8am to 5pm)

## 2016-10-19 NOTE — ED Notes (Signed)
Attempted report 

## 2016-10-19 NOTE — ED Provider Notes (Signed)
[facility] DEPT Provider Note   CSN: [id number] Arrival date & time: 10/19/16  0041  Time seen 04:45 am   History   Chief Complaint Chief Complaint  Patient presents with  . Abdominal Pain  . Emesis    HPI [name] [name] is a 56 y.o. male.  HPI  patient states May 15 he started having abdominal pain that would come and go. He states that last a few hours at a time. He states it became constant May 15. He points to his umbilicus area. He states he's had this pain before with pancreatitis. He states he was staying at someone's house and was having back pain that he thought was from the mattress. He states he's had nausea and vomiting and has had 14 episodes of vomiting while waiting in the ED triage to be seen. He denies any diarrhea. He states his last alcohol was 2 weeks ago. He denies eating any different foods. He states he has not been told he has gallstones. He states he was last admitted in December for the same thing. He states at that time he had been on trajenta, which his doctor stopped in December because it is associated with causing pancreatitis.  PCP Laurena Slimmer, MD   Past Medical History:  Diagnosis Date  . Essential hypertension   . Family history of adverse reaction to anesthesia    "mom stopped breathing for awhile; shortness of breath" (05/16/2016)  . Pancreatitis 2012; 05/16/2016  . Paroxysmal atrial fibrillation (HCC)    2012 with pancreatitis, resolved quickly  . Type II diabetes mellitus [facility])     Patient Active Problem List   Diagnosis Date Noted  . Abdominal pain, chronic, epigastric   . Intractable cyclical vomiting with nausea   . Constipation 05/16/2016  . Diabetic neuropathy (HCC) 05/16/2016  . Chronic pain syndrome 05/16/2016  . Diabetes mellitus with complication (HCC)   . Gastroesophageal reflux disease without esophagitis   . Insulin dependent type 2 diabetes mellitus, uncontrolled (HCC) 11/24/2015  . Essential hypertension  11/24/2015  . Pancreatitis 11/24/2015  . Dehydration with hyponatremia 11/24/2015  . Anemia 11/24/2015    Past Surgical History:  Procedure Laterality Date  . ESOPHAGOGASTRODUODENOSCOPY (EGD) WITH PROPOFOL N/A 05/18/2016   Procedure: ESOPHAGOGASTRODUODENOSCOPY (EGD) WITH PROPOFOL;  Surgeon: Napoleon Form, MD;  Location: [facility];  Service: Endoscopy;  Laterality: N/A;  . NO PAST SURGERIES         Home Medications    Prior to Admission medications   Medication Sig Start Date End Date Taking? Authorizing Provider  glyBURIDE-metformin (GLUCOVANCE) 5-500 MG per tablet Take 1 tablet by mouth 2 (two) times daily with a meal.   Yes [provider]  HUMALOG MIX 75/25 KWIKPEN (75-25) 100 UNIT/ML Kwikpen 12-20 Units by Subconjunctival route See admin instructions. Use 20 units every morning and use 12 units every evening 08/30/16  Yes [provider]  lisinopril (PRINIVIL,ZESTRIL) 5 MG tablet Take 5 mg by mouth daily.   Yes [provider]  metoCLOPramide (REGLAN) 10 MG tablet Take 1 tablet (10 mg total) by mouth 4 (four) times daily -  before meals and at bedtime. 05/19/16  Yes Osvaldo Shipper, MD  pantoprazole (PROTONIX) 40 MG tablet Take 1 tablet (40 mg total) by mouth 2 (two) times daily. 05/19/16  Yes Osvaldo Shipper, MD  polyethylene glycol [facility]) packet Take 17 g by mouth daily. 05/19/16  Yes Osvaldo Shipper, MD  ondansetron (ZOFRAN ODT) 8 MG disintegrating tablet Take 1 tablet (8 mg  total) by mouth every 8 (eight) hours as needed. Patient not taking: Reported on 10/19/2016 05/13/16   Zadie Rhine, MD  oxyCODONE (ROXICODONE) 5 MG immediate release tablet Take 1 tablet (5 mg total) by mouth every 6 (six) hours as needed for severe pain. Patient not taking: Reported on 10/19/2016 05/19/16   Osvaldo Shipper, MD    Family History Family History  Problem Relation Age of Onset  . Diabetes Mother   . Chronic bronchitis Mother   . Lupus Maternal Aunt       Social History Social History  Substance Use Topics  . Smoking status: Current Every Day Smoker    Packs/day: 0.50    Years: 35.00    Types: Cigarettes  . Smokeless tobacco: Never Used  . Alcohol use Yes     Comment: 05/16/2016 "a couple beers maybe q other weekend"     Allergies   Bee venom   Review of Systems Review of Systems  All other systems reviewed and are negative.    Physical Exam Updated Vital Signs BP (!) 154/75   Pulse 73   Temp 98 F (36.7 C) (Oral)   Resp 19   SpO2 97%   Vital signs normal except for hypertension   Physical Exam  Constitutional: He is oriented to person, place, and time. He appears well-developed and well-nourished.  Non-toxic appearance. He does not appear ill. No distress.  Patient actively dry heaving and vomiting during our interview  HENT:  Head: Normocephalic and atraumatic.  Right Ear: External ear normal.  Left Ear: External ear normal.  Nose: Nose normal. No mucosal edema or rhinorrhea.  Mouth/Throat: Oropharynx is clear and moist and mucous membranes are normal. No dental abscesses or uvula swelling.  Eyes: Conjunctivae and EOM are normal. Pupils are equal, round, and reactive to light.  Neck: Normal range of motion and full passive range of motion without pain. Neck supple.  Cardiovascular: Normal rate, regular rhythm and normal heart sounds.  Exam reveals no gallop and no friction rub.   No murmur heard. Pulmonary/Chest: Effort normal and breath sounds normal. No respiratory distress. He has no wheezes. He has no rhonchi. He has no rales. He exhibits no tenderness and no crepitus.  Abdominal: Soft. Normal appearance and bowel sounds are normal. He exhibits no distension. There is tenderness in the periumbilical area. There is no rebound and no guarding.    Musculoskeletal: Normal range of motion. He exhibits no edema or tenderness.  Moves all extremities well.   Neurological: He is alert and oriented to person,  place, and time. He has normal strength. No cranial nerve deficit.  Skin: Skin is warm, dry and intact. No rash noted. No erythema. No pallor.  Psychiatric: He has a normal mood and affect. His speech is normal and behavior is normal. His mood appears not anxious.  Nursing note and vitals reviewed.    ED Treatments / Results  Labs (all labs ordered are listed, but only abnormal results are displayed) Results for orders placed or performed during the hospital encounter of 10/19/16  Lipase, blood  Result Value Ref Range   Lipase 111 (H) 11 - 51 U/L  Comprehensive metabolic panel  Result Value Ref Range   Sodium 136 135 - 145 mmol/L   Potassium 4.1 3.5 - 5.1 mmol/L   Chloride 102 101 - 111 mmol/L   CO2 26 22 - 32 mmol/L   Glucose, Bld 238 (H) 65 - 99 mg/dL   BUN 15 6 - 20  mg/dL   Creatinine, Ser 4.091.25 (H) 0.61 - 1.24 mg/dL   Calcium 9.4 8.9 - 81.110.3 mg/dL   Total Protein 6.8 6.5 - 8.1 g/dL   Albumin 4.0 3.5 - 5.0 g/dL   AST 16 15 - 41 U/L   ALT 16 (L) 17 - 63 U/L   Alkaline Phosphatase 47 38 - 126 U/L   Total Bilirubin 0.2 (L) 0.3 - 1.2 mg/dL   GFR calc non Af Amer >60 >60 mL/min   GFR calc Af Amer >60 >60 mL/min   Anion gap 8 5 - 15  CBC  Result Value Ref Range   WBC 6.0 4.0 - 10.5 K/uL   RBC 5.20 4.22 - 5.81 MIL/uL   Hemoglobin 12.4 (L) 13.0 - 17.0 g/dL   HCT 91.438.9 (L) 78.239.0 - 95.652.0 %   MCV 74.8 (L) 78.0 - 100.0 fL   MCH 23.8 (L) 26.0 - 34.0 pg   MCHC 31.9 30.0 - 36.0 g/dL   RDW 21.315.5 08.611.5 - 57.815.5 %   Platelets 238 150 - 400 K/uL  Urinalysis, Routine w reflex microscopic  Result Value Ref Range   Color, Urine YELLOW YELLOW   APPearance CLEAR CLEAR   Specific Gravity, Urine 1.019 1.005 - 1.030   pH 5.0 5.0 - 8.0   Glucose, UA 50 (A) NEGATIVE mg/dL   Hgb urine dipstick SMALL (A) NEGATIVE   Bilirubin Urine NEGATIVE NEGATIVE   Ketones, ur NEGATIVE NEGATIVE mg/dL   Protein, ur 469100 (A) NEGATIVE mg/dL   Nitrite NEGATIVE NEGATIVE   Leukocytes, UA NEGATIVE NEGATIVE   RBC / HPF  0-5 0 - 5 RBC/hpf   WBC, UA 0-5 0 - 5 WBC/hpf   Bacteria, UA NONE SEEN NONE SEEN   Squamous Epithelial / LPF NONE SEEN NONE SEEN  POC CBG, ED  Result Value Ref Range   Glucose-Capillary 253 (H) 65 - 99 mg/dL   Laboratory interpretation all normal except  Mild glucosuria without UTI, hyperglycemia without metabolic acidosis, mild anemia, elevation of lipase similar to what he's had in the past    EKG  EKG Interpretation None       Radiology No results found.  Procedures Procedures (including critical care time)  Medications Ordered in ED Medications  sodium chloride 0.9 % bolus 1,000 mL (not administered)  0.9 %  sodium chloride infusion (not administered)  HYDROmorphone (DILAUDID) injection 1 mg (not administered)  ondansetron (ZOFRAN) injection 4 mg (not administered)  fentaNYL (SUBLIMAZE) injection 50 mcg (not administered)  ondansetron (ZOFRAN) injection 4 mg (not administered)  sodium chloride 0.9 % bolus 1,000 mL (1,000 mLs Intravenous New Bag/Given 10/19/16 0607)  fentaNYL (SUBLIMAZE) injection 50 mcg (50 mcg Intravenous Given 10/19/16 0514)  ondansetron (ZOFRAN) injection 4 mg (4 mg Intravenous Given 10/19/16 0514)  metoCLOPramide (REGLAN) injection 10 mg (10 mg Intravenous Given 10/19/16 0514)  sodium chloride 0.9 % bolus 1,000 mL (0 mLs Intravenous Stopped 10/19/16 0607)     Initial Impression / Assessment and Plan / ED Course  I have reviewed the triage vital signs and the nursing notes.  Pertinent labs & imaging results that were available during my care of the patient were reviewed by me and considered in my medical decision making (see chart for details).  Patient was started on IV fluids, he was given IV pain and nausea medication including Reglan. I reviewed his discharge summary from December and at that point he had been admitted in June with similar abdominal pain and minor elevation of his LFTs with  a normal CT scan without showing acute pancreatitis.  They're feeling was his pain may be more from gastroparesis. He was treated with Reglan and did improve.  Recheck at 6:15 AM patient is continuing to have vomiting, states he still having pain. At this point he feels like he will need inpatient treatment.  06:30 AM Dr Clyde Lundborg, will admit.  Final Clinical Impressions(s) / ED Diagnoses   Final diagnoses:  Intractable cyclical vomiting with nausea  Diabetic gastroparesis [facility])    Plan admission  Devoria Albe, MD, Concha Pyo, MD 10/19/16 [phone]

## 2016-10-19 NOTE — ED Triage Notes (Signed)
Pt reports RLQ abdominal sharp pain starting Sunday.  Reports that his has been happening every "few months."  Reports vomiting today and no bowel movement today as well.  Denies fevers, weakness

## 2016-10-19 NOTE — Progress Notes (Signed)
Patient trasfered from ED to 5W05 via wheelchair; alert and oriented x 4; complaints of pain in abdomen (pt was medicated in ED); IV saline locked in RAC - started NS@125cc /hrs per order. Orient patient to room and unit; gave patient care guide; instructed how to use the call bell and  fall risk precautions. Will continue to monitor the patient.

## 2016-10-19 NOTE — H&P (Signed)
History and Physical    [name] [name] WUJ:[id number] DOB: 1960-11-02 DOA: 10/19/2016  PCP: Laurena Slimmer, MD Patient coming from: home  Chief Complaint: intractable nausea and vomiting/abdominal pain  HPI: [name] [name] is a very pleasant 56 y.o. male with medical history significant obese, hypertension, tobacco use, EtOH use, chronic neuropathic pain, anemia, intractable cyclic vomiting with nausea presents emergency Department chief complaint persistent abdominal pain and intractable nausea and vomiting. Workup reveals elevated lipase and acute kidney injury.  Information is obtained from the patient. He reports four-day history persistent nausea with vomiting and abdominal pain. Describes the pain as constant sharp located in the epigastric area. Reports one episode of emesis at home prior to presentation consisting of mostly undigested food. He denies any coffee ground emesis. He reports 14 episodes since he's been in the emergency department consisted mostly dry heaving. He denies headache dizziness syncope or near-syncope. He denies fever chills chest pain palpitation shortness of breath diaphoresis. He does report a propensity for constipation and states his last bowel movement was 2 days ago. He denies dysuria hematuria frequency or urgency. He states he's been in the hospital several times with the same symptoms in that current symptoms reminiscent of previous episodes. He denies EtOH consumption for the last 2 weeks.   ED Course: In the emergency department is afebrile hemodynamically stable with a blood pressure on the high end of normal not hypoxic. He is provided with 3 L of normal saline fentanyl Zofran Reglan.   Review of Systems: As per HPI otherwise all other systems reviewed and are negative.   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 Essential hypertension   . Family  history of adverse reaction to anesthesia    "mom stopped breathing for awhile; shortness of breath" (05/16/2016)  . Intractable nausea and vomiting   . Pancreatitis 2012; 05/16/2016  . Paroxysmal atrial fibrillation (HCC)    2012 with pancreatitis, resolved quickly  . Type II diabetes mellitus (HCC)     Past Surgical History:  Procedure Laterality Date  . ESOPHAGOGASTRODUODENOSCOPY (EGD) WITH PROPOFOL N/A 05/18/2016   Procedure: ESOPHAGOGASTRODUODENOSCOPY (EGD) WITH PROPOFOL;  Surgeon: Napoleon Form, MD;  Location: [facility] END[facility];  Service: Endoscopy;  Laterality: N/A;  . NO PAST SURGERIES      Social History   Social History  . Marital status: Married    Spouse name: N/A  . Number of children: N/A  . Years of education: N/A   Occupational History  . Not on file.   Social History Main Topics  . Smoking status: Current Every Day Smoker    Packs/day: 0.50    Years: 35.00    Types: Cigarettes  . Smokeless tobacco: Never Used  . Alcohol use Yes     Comment: 05/16/2016 "a couple beers maybe q other weekend"  . Drug use: Yes    Types: Marijuana     Comment: Not daily-reports utilize this as needed to supplement chronic pain medications  . Sexual activity: Yes   Other Topics Concern  . Not on file   Social History Narrative  . No narrative on file   Lives at home with wife. Employed as machinest Allergies  Allergen Reactions  . Bee Venom Swelling    Family History  Problem Relation Age of Onset  . Diabetes Mother   . Chronic bronchitis Mother   . Lupus Maternal Aunt     Prior to  Admission medications   Medication Sig Start Date End Date Taking? Authorizing Provider  glyBURIDE-metformin (GLUCOVANCE) 5-500 MG per tablet Take 1 tablet by mouth 2 (two) times daily with a meal.   Yes [provider]  HUMALOG MIX 75/25 KWIKPEN (75-25) 100 UNIT/ML Kwikpen 12-20 Units by Subconjunctival route See admin instructions. Use 20 units every morning and use 12  units every evening 08/30/16  Yes [provider]  lisinopril (PRINIVIL,ZESTRIL) 5 MG tablet Take 5 mg by mouth daily.   Yes [provider]  metoCLOPramide (REGLAN) 10 MG tablet Take 1 tablet (10 mg total) by mouth 4 (four) times daily -  before meals and at bedtime. 05/19/16  Yes Osvaldo ShipperKrishnan, Gokul, MD  pantoprazole (PROTONIX) 40 MG tablet Take 1 tablet (40 mg total) by mouth 2 (two) times daily. 05/19/16  Yes Osvaldo ShipperKrishnan, Gokul, MD  polyethylene glycol [facility](MIRALAX) packet Take 17 g by mouth daily. 05/19/16  Yes Osvaldo ShipperKrishnan, Gokul, MD    Physical Exam: Vitals:   10/19/16 0415 10/19/16 0445 10/19/16 0630 10/19/16 0700  BP: (!) 167/79 (!) 154/75 (!) 158/87   Pulse:   68 76  Resp:   16 18  Temp:      TempSrc:      SpO2: 100% 97% 99% 94%     General:  Appears slightly anxious but not uncomfortable Eyes:  PERRL, EOMI, normal lids, iris ENT:  grossly normal hearing, lips & tongue, mucous membranes pink but dry Neck:  no LAD, masses or thyromegaly Cardiovascular:  RRR, no m/r/g. No LE edema.  Respiratory:  CTA bilaterally, no w/r/r. Normal respiratory effort. Abdomen:  Soft nondistended mild tenderness in epigastric area to palpation sluggish bowel sounds no guarding or rebounding Skin:  no rash or induration seen on limited exam Musculoskeletal:  grossly normal tone BUE/BLE, good ROM, no bony abnormality Psychiatric:  grossly normal mood and affect, speech fluent and appropriate, AOx3 Neurologic:  CN 2-12 grossly intact, moves all extremities in coordinated fashion, sensation intact  Labs on Admission: I have personally reviewed following labs and imaging studies  CBC:  Recent Labs Lab 10/19/16 0059  WBC 6.0  HGB 12.4*  HCT 38.9*  MCV 74.8*  PLT 238   Basic Metabolic Panel:  Recent Labs Lab 10/19/16 0059  NA 136  K 4.1  CL 102  CO2 26  GLUCOSE 238*  BUN 15  CREATININE 1.25*  CALCIUM 9.4   GFR: CrCl cannot be calculated (Unknown ideal weight.). Liver  Function Tests:  Recent Labs Lab 10/19/16 0059  AST 16  ALT 16*  ALKPHOS 47  BILITOT 0.2*  PROT 6.8  ALBUMIN 4.0    Recent Labs Lab 10/19/16 0059  LIPASE 111*   No results for input(s): AMMONIA in the last 168 hours. Coagulation Profile: No results for input(s): INR, PROTIME in the last 168 hours. Cardiac Enzymes: No results for input(s): CKTOTAL, CKMB, CKMBINDEX, TROPONINI in the last 168 hours. BNP (last 3 results) No results for input(s): PROBNP in the last 8760 hours. HbA1C: No results for input(s): HGBA1C in the last 72 hours. CBG:  Recent Labs Lab 10/19/16 0106  GLUCAP 253*   Lipid Profile: No results for input(s): CHOL, HDL, LDLCALC, TRIG, CHOLHDL, LDLDIRECT in the last 72 hours. Thyroid Function Tests: No results for input(s): TSH, T4TOTAL, FREET4, T3FREE, THYROIDAB in the last 72 hours. Anemia Panel: No results for input(s): VITAMINB12, FOLATE, FERRITIN, TIBC, IRON, RETICCTPCT in the last 72 hours. Urine analysis:    Component Value Date/Time   COLORURINE YELLOW  10/19/2016 0101   APPEARANCEUR CLEAR 10/19/2016 0101   LABSPEC 1.019 10/19/2016 0101   PHURINE 5.0 10/19/2016 0101   GLUCOSEU 50 (A) 10/19/2016 0101   HGBUR SMALL (A) 10/19/2016 0101   BILIRUBINUR NEGATIVE 10/19/2016 0101   KETONESUR NEGATIVE 10/19/2016 0101   PROTEINUR 100 (A) 10/19/2016 0101   UROBILINOGEN 0.2 09/05/2010 0945   NITRITE NEGATIVE 10/19/2016 0101   LEUKOCYTESUR NEGATIVE 10/19/2016 0101    Creatinine Clearance: CrCl cannot be calculated (Unknown ideal weight.).  Sepsis Labs: @LABRCNTIP (procalcitonin:4,lacticidven:4) )No results found for this or any previous visit (from the past 240 hour(s)).   Radiological Exams on Admission: No results found.  EKG:  Assessment/Plan Principal Problem:   Pancreatitis Active Problems:   Insulin dependent type 2 diabetes mellitus, uncontrolled (HCC)   Essential hypertension   Diabetic neuropathy (HCC)   Chronic pain syndrome    Diabetes mellitus with complication (HCC)   Abdominal pain, chronic, epigastric   Intractable cyclical vomiting with nausea   Acute kidney injury (HCC)   Tobacco abuse   #1. Pancreatitis. Lipase 111. Reports no alcohol in over 2 weeks. Previous lipid panel with normal triglycerides. CT last year without evidence of pancreatitis.EGD done in December 2017 last hospitalization revealed mildly severe erosive esophagitis no gross lesions in the stomach. Of note last hospitalization evaluated by GI who felt symptoms most likely due to diabetic gastroparesis. He was started on Reglan and improved -Admit to medical bed -Continue vigorous IV fluids -Nothing by mouth -Scheduled Zofran and Reglan with when necessary Phenergan -Pain management -Limited ultrasound -obtain lipid panel -Repeat lipase in the morning -Diabetes control  #2. Acute kidney injury. Likely related to above. Creatinine 1.2 on admission. -Hold nephrotoxins for now -Vigorous IV fluids as noted above -Monitor urine output -Recheck in the morning  #3. Hypertension. BP on high end of normal in ED.  Home medications include lisinopril -Hold lisinopril for now -PRN hydralazine -pain control -monitor  #4. Diabetes. Uncontrolled. Hemoglobin A1c 5 months ago greater than 11. Home medications include glucovance, 70/30 insulin -Obtain a hemoglobin A1c -Continue home 70/30 -Sliding scale for optimal control  #5. Constipation. Patient has a propensity for constipation likely related to pain meds for chronic pain syndrome. Home medications include miralax. -once tolerating po provide MOM     DVT prophylaxis: lovenox  Code Status: full  Family Communication: none present  Disposition Plan: home  Consults called: none  Admission status: obs    Toya Smothers M MD [facility]  If 7PM-7AM, please contact night-coverage [email or web address] Password [facility]  10/19/2016, 7:36 AM

## 2016-10-20 DIAGNOSIS — E1165 Type 2 diabetes mellitus with hyperglycemia: Secondary | ICD-10-CM | POA: Diagnosis present

## 2016-10-20 DIAGNOSIS — E11649 Type 2 diabetes mellitus with hypoglycemia without coma: Secondary | ICD-10-CM | POA: Diagnosis present

## 2016-10-20 DIAGNOSIS — Z79899 Other long term (current) drug therapy: Secondary | ICD-10-CM | POA: Diagnosis not present

## 2016-10-20 DIAGNOSIS — I480 Paroxysmal atrial fibrillation: Secondary | ICD-10-CM | POA: Diagnosis present

## 2016-10-20 DIAGNOSIS — K859 Acute pancreatitis without necrosis or infection, unspecified: Secondary | ICD-10-CM | POA: Diagnosis present

## 2016-10-20 DIAGNOSIS — R1013 Epigastric pain: Secondary | ICD-10-CM | POA: Diagnosis present

## 2016-10-20 DIAGNOSIS — F1290 Cannabis use, unspecified, uncomplicated: Secondary | ICD-10-CM | POA: Diagnosis present

## 2016-10-20 DIAGNOSIS — E860 Dehydration: Secondary | ICD-10-CM | POA: Diagnosis present

## 2016-10-20 DIAGNOSIS — E1143 Type 2 diabetes mellitus with diabetic autonomic (poly)neuropathy: Secondary | ICD-10-CM | POA: Diagnosis present

## 2016-10-20 DIAGNOSIS — E118 Type 2 diabetes mellitus with unspecified complications: Secondary | ICD-10-CM | POA: Diagnosis not present

## 2016-10-20 DIAGNOSIS — I10 Essential (primary) hypertension: Secondary | ICD-10-CM | POA: Diagnosis present

## 2016-10-20 DIAGNOSIS — K219 Gastro-esophageal reflux disease without esophagitis: Secondary | ICD-10-CM | POA: Diagnosis present

## 2016-10-20 DIAGNOSIS — Z7289 Other problems related to lifestyle: Secondary | ICD-10-CM | POA: Diagnosis not present

## 2016-10-20 DIAGNOSIS — K3184 Gastroparesis: Secondary | ICD-10-CM | POA: Diagnosis present

## 2016-10-20 DIAGNOSIS — K5900 Constipation, unspecified: Secondary | ICD-10-CM | POA: Diagnosis present

## 2016-10-20 DIAGNOSIS — Z91030 Bee allergy status: Secondary | ICD-10-CM | POA: Diagnosis not present

## 2016-10-20 DIAGNOSIS — G894 Chronic pain syndrome: Secondary | ICD-10-CM | POA: Diagnosis present

## 2016-10-20 DIAGNOSIS — Z794 Long term (current) use of insulin: Secondary | ICD-10-CM | POA: Diagnosis not present

## 2016-10-20 DIAGNOSIS — Z720 Tobacco use: Secondary | ICD-10-CM | POA: Diagnosis not present

## 2016-10-20 DIAGNOSIS — K8500 Idiopathic acute pancreatitis without necrosis or infection: Secondary | ICD-10-CM | POA: Diagnosis not present

## 2016-10-20 DIAGNOSIS — F17210 Nicotine dependence, cigarettes, uncomplicated: Secondary | ICD-10-CM | POA: Diagnosis present

## 2016-10-20 DIAGNOSIS — G43A1 Cyclical vomiting, intractable: Secondary | ICD-10-CM | POA: Diagnosis present

## 2016-10-20 DIAGNOSIS — N179 Acute kidney failure, unspecified: Secondary | ICD-10-CM | POA: Diagnosis present

## 2016-10-20 LAB — GLUCOSE, CAPILLARY
GLUCOSE-CAPILLARY: 132 mg/dL — AB (ref 65–99)
GLUCOSE-CAPILLARY: 341 mg/dL — AB (ref 65–99)
GLUCOSE-CAPILLARY: 44 mg/dL — AB (ref 65–99)
Glucose-Capillary: 102 mg/dL — ABNORMAL HIGH (ref 65–99)
Glucose-Capillary: 121 mg/dL — ABNORMAL HIGH (ref 65–99)
Glucose-Capillary: 132 mg/dL — ABNORMAL HIGH (ref 65–99)
Glucose-Capillary: 60 mg/dL — ABNORMAL LOW (ref 65–99)
Glucose-Capillary: 75 mg/dL (ref 65–99)

## 2016-10-20 LAB — CBC
HCT: 33.6 % — ABNORMAL LOW (ref 39.0–52.0)
Hemoglobin: 10.4 g/dL — ABNORMAL LOW (ref 13.0–17.0)
MCH: 23.5 pg — AB (ref 26.0–34.0)
MCHC: 31 g/dL (ref 30.0–36.0)
MCV: 76 fL — AB (ref 78.0–100.0)
PLATELETS: 182 10*3/uL (ref 150–400)
RBC: 4.42 MIL/uL (ref 4.22–5.81)
RDW: 15.7 % — AB (ref 11.5–15.5)
WBC: 6.2 10*3/uL (ref 4.0–10.5)

## 2016-10-20 LAB — COMPREHENSIVE METABOLIC PANEL
ALT: 25 U/L (ref 17–63)
AST: 24 U/L (ref 15–41)
Albumin: 3.1 g/dL — ABNORMAL LOW (ref 3.5–5.0)
Alkaline Phosphatase: 35 U/L — ABNORMAL LOW (ref 38–126)
Anion gap: 9 (ref 5–15)
BUN: 10 mg/dL (ref 6–20)
CHLORIDE: 108 mmol/L (ref 101–111)
CO2: 22 mmol/L (ref 22–32)
CREATININE: 1.1 mg/dL (ref 0.61–1.24)
Calcium: 8.5 mg/dL — ABNORMAL LOW (ref 8.9–10.3)
GFR calc Af Amer: >60 mL/min (ref >60)
GLUCOSE: 120 mg/dL — AB (ref 65–99)
Potassium: 3.6 mmol/L (ref 3.5–5.1)
Sodium: 139 mmol/L (ref 135–145)
Total Bilirubin: 0.4 mg/dL (ref 0.3–1.2)
Total Protein: 5.3 g/dL — ABNORMAL LOW (ref 6.5–8.1)

## 2016-10-20 LAB — HEMOGLOBIN A1C
Hgb A1c MFr Bld: 8.5 % — ABNORMAL HIGH (ref 4.8–5.6)
Mean Plasma Glucose: 197 mg/dL

## 2016-10-20 LAB — LIPASE, BLOOD: Lipase: 147 U/L — ABNORMAL HIGH (ref 11–51)

## 2016-10-20 MED ORDER — DEXTROSE 50 % IV SOLN
INTRAVENOUS | Status: AC
  Administered 2016-10-20: 25 mL
  Filled 2016-10-20: qty 50

## 2016-10-20 MED ORDER — METOCLOPRAMIDE HCL 5 MG/ML IJ SOLN
10.0000 mg | Freq: Four times a day (QID) | INTRAMUSCULAR | Status: DC
  Administered 2016-10-20 – 2016-10-21 (×5): 10 mg via INTRAVENOUS
  Filled 2016-10-20 (×5): qty 2

## 2016-10-20 MED ORDER — PANTOPRAZOLE SODIUM 40 MG PO TBEC
40.0000 mg | DELAYED_RELEASE_TABLET | Freq: Every day | ORAL | Status: DC
  Administered 2016-10-21: 40 mg via ORAL
  Filled 2016-10-20: qty 1

## 2016-10-20 NOTE — Progress Notes (Signed)
[facility] PROGRESS NOTE  [name] [name] RUE:[id number] DOB: 24-May-1961 DOA: 10/19/2016 PCP: Laurena Slimmerlark, Preston S, MD  Interim summary and HPI 56 year old male with a past medical history significant for hypertension, diabetes (with gastroparesis and neuropathy), prior history of pancreatitis, tobacco abuse and alcohol use; who presented to emergency department secondary to intractable nausea/vomiting and abdominal pain. Patient was found to have acute pancreatitis.  Of note this is his third episode in the last 10 months.  Assessment/Plan: 1-pancreatitis: Patient with further episode of pancreatitis since June 2017. Reports no alcohol in the last 2 weeks. -Right upper quadrant ultrasound without signs of gallstones or dilatation of his CBD -Patient with improvement of his symptoms, even lipase still Elevated. -Continue supportive care -Continue IV fluids -Continue as needed antiemetics and analgesics -Will slowly advance his diet -Will repeat lipase and basic metabolic panel in a.m. -If the patient continues experiencing abdominal pain or worsening in symptoms otherwise will check CT abdomen and pelvis.  2-acute kidney injury -Secondary to prerenal azotemia given dehydration from GI losses and poor by mouth intake -Significant improvement/almost complete resolution with IV fluids -Will continue hydration and follow renal function trend -Management required the use of creon  3-hypotension -Overall stable to slightly soft -Will continue holding antihypertensive regimen for now. -Continue IV fluids  4-diabetes type 2 (chronically on insulin), with suspected gastroparesis and neuropathy. -Will continue sliding scale insulin and low-dose Lantus -Will adjust as needed as diet is advanced -Continue Reglan -A1c 8.5  5-tobacco abuse: -Smoking cessation counseling provided  6-GERD -Will continue PPI  Code Status: Full code Family Communication: No family at bedside Disposition  Plan: Remains in house; was slowly advanced diet and switch to oral medication regimen. If the patient failed to improve will get CT abdomen and pelvis.   Consultants:  None  Procedures:  See below for x-ray reports  Antibiotics:  None  HPI/Subjective: Afebrile, no chest pain or shortness of breath. Patient reports some improvement on his symptoms and overall feeling better. Still with some nausea but no further vomiting and expressed some discomfort in his abdomen.  Objective: Vitals:   10/20/16 0424 10/20/16 1318  BP: 92/79 133/74  Pulse: 86 68  Resp: 17 18  Temp: 98.2 F (36.8 C) 97.6 F (36.4 C)    Intake/Output Summary (Last 24 hours) at 10/20/16 1654 Last data filed at 10/20/16 1500  Gross per 24 hour  Intake          2718.75 ml  Output                0 ml  Net          2718.75 ml   Filed Weights   10/19/16 1200  Weight: 76.9 kg (169 lb 9.6 oz)    Exam:   General:    Afebrile, no chest pain, no shortness of breath. Patient reports feeling slightly nauseous still complaining of abdominal pain. He denies any further episodes of vomiting.  Cardiovascular: S1 and S2, no rubs, no gallops, no murmurs.  Respiratory: good air movement bilaterally, no wheezing, no crackles  Abdomen: fake/diffuse mid epigastric pain on palpation, no guarding, no distention, positive bowel sounds.  Musculoskeletal: no edema, no cyanosis or clubbing.   Data Reviewed: Basic Metabolic Panel:  Recent Labs Lab 10/19/16 0059 10/20/16 0622  NA 136 139  K 4.1 3.6  CL 102 108  CO2 26 22  GLUCOSE 238* 120*  BUN 15 10  CREATININE 1.25* 1.10  CALCIUM 9.4 8.5*   Liver  Function Tests:  Recent Labs Lab 10/19/16 0059 10/20/16 0622  AST 16 24  ALT 16* 25  ALKPHOS 47 35*  BILITOT 0.2* 0.4  PROT 6.8 5.3*  ALBUMIN 4.0 3.1*    Recent Labs Lab 10/19/16 0059 10/20/16 0622  LIPASE 111* 147*   CBC:  Recent Labs Lab 10/19/16 0059 10/20/16 0622  WBC 6.0 6.2  HGB  12.4* 10.4*  HCT 38.9* 33.6*  MCV 74.8* 76.0*  PLT 238 182   CBG:  Recent Labs Lab 10/20/16 0029 10/20/16 0422 10/20/16 0752 10/20/16 1243 10/20/16 1620  GLUCAP 132* 132* 102* 75 60*    Studies: US Abdomen Limited Ruq  Result Date: 10/19/2016 CLINICAL DATA:  Abdominal pain and vomiting for several days EXAM: US ABDOMEN LIMITED - RIGHT UPPER QUADRANT COMPARISON:  None. FINDINGS: Gallbladder: No gallstones or wall thickening visualized. No sonographic Murphy sign noted by sonographer. Common bile duct: Diameter: 3.6 mm. Liver: No focal lesion identified. Within normal limits in parenchymal echogenicity. IMPRESSION: No acute abnormality noted. Electronically Signed   By: Alcide Clever M.D.   On: 10/19/2016 09:19    Scheduled Meds: . enoxaparin (LOVENOX) injection  40 mg Subcutaneous Q24H  . insulin aspart  0-15 Units Subcutaneous Q4H  . insulin glargine  15 Units Subcutaneous QHS  . metoCLOPramide (REGLAN) injection  10 mg Intravenous Q6H   Continuous Infusions: . sodium chloride 125 mL/hr at 10/20/16 1105    Principal Problem:   Pancreatitis Active Problems:   Insulin dependent type 2 diabetes mellitus, uncontrolled (HCC)   Essential hypertension   Diabetic neuropathy (HCC)   Chronic pain syndrome   Diabetes mellitus with complication (HCC)   Abdominal pain, chronic, epigastric   Intractable cyclical vomiting with nausea   Acute kidney injury (HCC)   Tobacco abuse   Diabetic gastroparesis (HCC)    Time spent: 25 minutes    Vassie Loll  [facility] Pager [phone]. If 7PM-7AM, please contact night-coverage at [email or web address], password [facility] 10/20/2016, 4:54 PM  LOS: 0 days

## 2016-10-21 DIAGNOSIS — G894 Chronic pain syndrome: Secondary | ICD-10-CM

## 2016-10-21 LAB — BASIC METABOLIC PANEL
Anion gap: 7 (ref 5–15)
BUN: 8 mg/dL (ref 6–20)
CHLORIDE: 103 mmol/L (ref 101–111)
CO2: 28 mmol/L (ref 22–32)
CREATININE: 1.03 mg/dL (ref 0.61–1.24)
Calcium: 8.6 mg/dL — ABNORMAL LOW (ref 8.9–10.3)
GFR calc Af Amer: >60 mL/min (ref >60)
GFR calc non Af Amer: >60 mL/min (ref >60)
Glucose, Bld: 137 mg/dL — ABNORMAL HIGH (ref 65–99)
Potassium: 3.6 mmol/L (ref 3.5–5.1)
SODIUM: 138 mmol/L (ref 135–145)

## 2016-10-21 LAB — LIPASE, BLOOD: LIPASE: 36 U/L (ref 11–51)

## 2016-10-21 LAB — GLUCOSE, CAPILLARY
GLUCOSE-CAPILLARY: 198 mg/dL — AB (ref 65–99)
Glucose-Capillary: 123 mg/dL — ABNORMAL HIGH (ref 65–99)
Glucose-Capillary: 161 mg/dL — ABNORMAL HIGH (ref 65–99)
Glucose-Capillary: 87 mg/dL (ref 65–99)

## 2016-10-21 MED ORDER — PANTOPRAZOLE SODIUM 40 MG PO TBEC: 40.0000 mg | DELAYED_RELEASE_TABLET | Freq: Two times a day (BID) | ORAL | 1 refills | Status: AC

## 2016-10-21 MED ORDER — ONDANSETRON 8 MG PO TBDP: 8.0000 mg | ORAL_TABLET | Freq: Three times a day (TID) | ORAL | 0 refills | Status: AC | PRN

## 2016-10-21 MED ORDER — METOCLOPRAMIDE HCL 10 MG PO TABS: 10.0000 mg | ORAL_TABLET | Freq: Three times a day (TID) | ORAL | 1 refills | Status: AC

## 2016-10-21 NOTE — Discharge Summary (Signed)
Physician Discharge Summary  Zephyr Ridley WUJ:[id number] DOB: 01/09/1961 DOA: 10/19/2016  PCP: Laurena Slimmer, MD  Admit date: 10/19/2016 Discharge date: 10/21/2016  Time spent: 35 minutes  Recommendations for Outpatient Follow-up:  1. Repeat basic metabolic panel to follow electrolytes and renal function 2. Reassess patient blood pressure and adjust antihypertensive regimen as needed 3. Please follow up on patient's diabetes is/hypoglycemic regimen and adjust his insulin based on further CBG's fluctuation.   Discharge Diagnoses:  Principal Problem:   Pancreatitis Active Problems:   Insulin dependent type 2 diabetes mellitus, uncontrolled (HCC)   Essential hypertension   Diabetic neuropathy (HCC)   Chronic pain syndrome   Diabetes mellitus with complication (HCC)   Abdominal pain, chronic, epigastric   Intractable cyclical vomiting with nausea   Acute kidney injury (HCC)   Tobacco abuse   Diabetic gastroparesis (HCC)   Discharge Condition: Stable and improved. Discharged home with instruction to follow-up with PCP in 10 days.  Diet recommendation: Heart healthy/low fat diet  Filed Weights   10/19/16 1200  Weight: 76.9 kg (169 lb 9.6 oz)    Brief History of present illness:  56 year old male with a past medical history significant for hypertension, diabetes (with gastroparesis and neuropathy), prior history of pancreatitis, tobacco abuse and alcohol use; who presented to emergency department secondary to intractable nausea/vomiting and abdominal pain. Patient was found to have acute pancreatitis.  Of note this is his third episode in the last 10 months.  Hospital Course:  1-pancreatitis: Patient with further episode of pancreatitis since June 2017. Initially reported no alcohol in the last 2 weeks; but after further discussion endorses having 1-2 beers in the last day or so prior to admission while visiting his father. These make alcohol most likely the main culprit for  his pancreatitis cause. -Right upper quadrant ultrasound without signs of gallstones or dilatation of his CBD -Patient with significant improvement in his symptoms. Rate to be discharge and encouraged to follow up with PCP. -Patient advise to keep himself well-hydrated. -Continue as needed antiemetics and analgesics -Diet advanced to a low-fat. -repeat lipase within normal limits at discharge.  2-acute kidney injury -Secondary to prerenal azotemia given dehydration from GI losses and poor by mouth intake -Improved/resolved after fluid resuscitation   3-hypotension -Stable overall.  -At discharge will resume home antihypertensive agents. -Patient advised to follow a heart healthy/low sodium diet.   4-diabetes type 2 (chronically on insulin), with suspected gastroparesis and neuropathy. -Will resume home hypoglycemic regimen -Will Continue Reglan -Patient advised to follow a modified carbohydrate diet -A1c 8.5 (which demonstrated improvement from prior A1c in the 11 range).  5-tobacco abuse: -Smoking cessation counseling provided -Patient declines nicotine patch  6-GERD -Will continue PPI  Procedures:  See below for x-ray reports  Consultations:  None  Discharge Exam: Vitals:   10/20/16 2055 10/21/16 0456  BP: 138/71 (!) 143/79  Pulse: 65 64  Resp: 18 17  Temp: 98 F (36.7 C) 98 F (36.7 C)     General:  Afebrile, no chest pain, no shortness of breath. Patient reports feelingGreat and denies any further episode of nausea, vomiting or significant abdominal pain. He is hungry and tolerating by mouth medications and low-fat diet.   Cardiovascular: S1 and S2, no rubs, no gallops, no murmurs.  Respiratory: good air movement bilaterally, no wheezing, no crackles  Abdomen: vague mid epigastric discomfort on deep palpation, no guarding, no distention, positive bowel sounds.  Musculoskeletal: no edema, no cyanosis or clubbing.   Discharge  Instructions  Discharge Instructions    Discharge instructions    Complete by:  As directed    Keep yourself well-hydrated Take medications as prescribed Follow a low-fat and modify carbohydrates diet Arrange follow-up with PCP in 10 days Stop alcohol consumption     Current Discharge Medication List    START taking these medications   Details  ondansetron (ZOFRAN ODT) 8 MG disintegrating tablet Take 1 tablet (8 mg total) by mouth every 8 (eight) hours as needed for nausea or vomiting. Qty: 20 tablet, Refills: 0      CONTINUE these medications which have CHANGED   Details  metoCLOPramide (REGLAN) 10 MG tablet Take 1 tablet (10 mg total) by mouth 4 (four) times daily -  before meals and at bedtime. Qty: 120 tablet, Refills: 1    pantoprazole (PROTONIX) 40 MG tablet Take 1 tablet (40 mg total) by mouth 2 (two) times daily. Qty: 60 tablet, Refills: 1      CONTINUE these medications which have NOT CHANGED   Details  glyBURIDE-metformin (GLUCOVANCE) 5-500 MG per tablet Take 1 tablet by mouth 2 (two) times daily with a meal.    HUMALOG MIX 75/25 KWIKPEN (75-25) 100 UNIT/ML Kwikpen Inject 12-20 Units into the skin See admin instructions. Use 20 units every morning and use 12 units every evening Refills: 0    lisinopril (PRINIVIL,ZESTRIL) 5 MG tablet Take 5 mg by mouth daily.    polyethylene glycol (MIRALAX) packet Take 17 g by mouth daily. Qty: 14 each, Refills: 0       Allergies  Allergen Reactions  . Bee Venom Swelling   Follow-up Information    Laurena Slimmerlark, Preston S, MD. Schedule an appointment as soon as possible for a visit in 10 day(s).   Specialty:  Internal Medicine Contact information: [address] [place] [address] [phone]           The results of significant diagnostics from this hospitalization (including imaging, microbiology, ancillary and laboratory) are listed below for reference.    Significant Diagnostic Studies: [place]  Abdomen Limited Ruq  Result Date: 10/19/2016 CLINICAL DATA:  Abdominal pain and vomiting for several days EXAM: US ABDOMEN LIMITED - RIGHT UPPER QUADRANT COMPARISON:  None. FINDINGS: Gallbladder: No gallstones or wall thickening visualized. No sonographic Murphy sign noted by sonographer. Common bile duct: Diameter: 3.6 mm. Liver: No focal lesion identified. Within normal limits in parenchymal echogenicity. IMPRESSION: No acute abnormality noted. Electronically Signed   By: Alcide CleverMark  Lukens M.D.   On: 10/19/2016 09:19    Labs: Basic Metabolic Panel:  Recent Labs Lab 10/19/16 0059 10/20/16 0622 10/21/16 0345  NA 136 139 138  K 4.1 3.6 3.6  CL 102 108 103  CO2 26 22 28   GLUCOSE 238* 120* 137*  BUN 15 10 8   CREATININE 1.25* 1.10 1.03  CALCIUM 9.4 8.5* 8.6*   Liver Function Tests:  Recent Labs Lab 10/19/16 0059 10/20/16 0622  AST 16 24  ALT 16* 25  ALKPHOS 47 35*  BILITOT 0.2* 0.4  PROT 6.8 5.3*  ALBUMIN 4.0 3.1*    Recent Labs Lab 10/19/16 0059 10/20/16 0622 10/21/16 0345  LIPASE 111* 147* 36   CBC:  Recent Labs Lab 10/19/16 0059 10/20/16 0622  WBC 6.0 6.2  HGB 12.4* 10.4*  HCT 38.9* 33.6*  MCV 74.8* 76.0*  PLT 238 182   CBG:  Recent Labs Lab 10/20/16 2339 10/21/16 0028 10/21/16 0427 10/21/16 0757 10/21/16 1214  GLUCAP 44* 87 161* 123* 198*  Signed:  Vassie Loll MD.  [facility] 10/21/2016, 2:27 PM

## 2016-10-21 NOTE — Progress Notes (Signed)
Patient was hypoglycemic at 2339 cbg 44. Patient given apple juice added some sugar.recheck cbg was 87.

## 2016-10-21 NOTE — Progress Notes (Signed)
Orlin HildingGregory Lococo to be D/C'd to home per MD order.  Discussed with the patient and all questions fully answered.  VSS, Skin clean, dry and intact without evidence of skin break down, no evidence of skin tears noted. IV catheter discontinued intact. Site without signs and symptoms of complications. Dressing and pressure applied.  An After Visit Summary was printed and given to the patient. Patient received prescriptions.  D/c education completed with patient/family including follow up instructions, medication list, d/c activities limitations if indicated, with other d/c instructions as indicated by MD - patient able to verbalize understanding, all questions fully answered.   Patient instructed to return to ED, call 911, or call MD for any changes in condition.   Patient escorted via WC, and D/C home via private auto.  Theressa StampsKayla L Price 10/21/2016 3:34 PM

## 2017-03-30 IMAGING — CT CT ABD-PELV W/ CM
2 of 5 series · 16 of 46 positions shown, 18 images · IV contrast (APPLIED)
Comparison: None.

CLINICAL DATA: Patient with severe upper abdominal pain for 4 days.
History of pancreatitis.

EXAM:
CT ABDOMEN AND PELVIS WITH CONTRAST
TECHNIQUE: Multidetector CT imaging of the abdomen and pelvis was performed
using the standard protocol following bolus administration of
intravenous contrast.
CONTRAST:  100mL [id number] IOPAMIDOL ([id number]-QRR) INJECTION 61%

[Series 2: abd/ pelvis 5.0 i30f 1 · axial · 0.67mm/px · z∈[+932,+1332]mm · 13 of 91 slices shown, 15 images]
[im 6/91  soft-tissue]
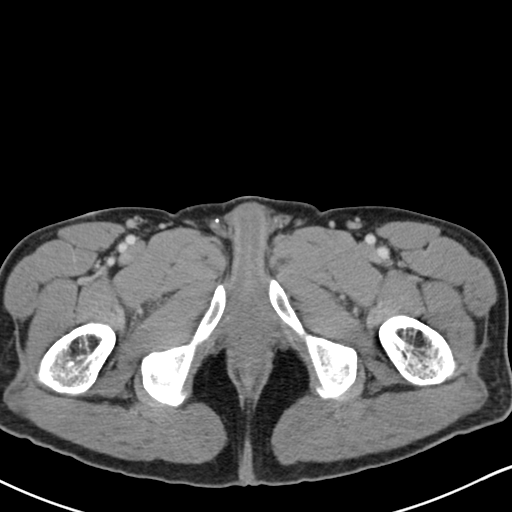
[im 6/91  bone]
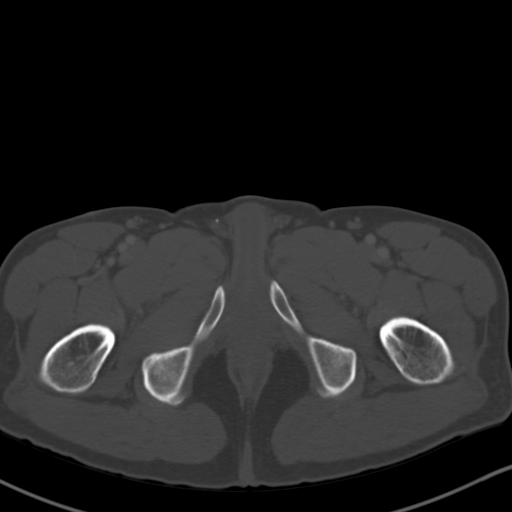
[im 11/91  soft-tissue]
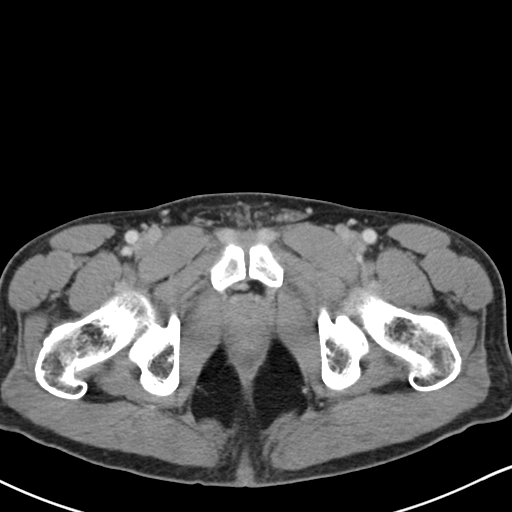
[im 21/91  soft-tissue]
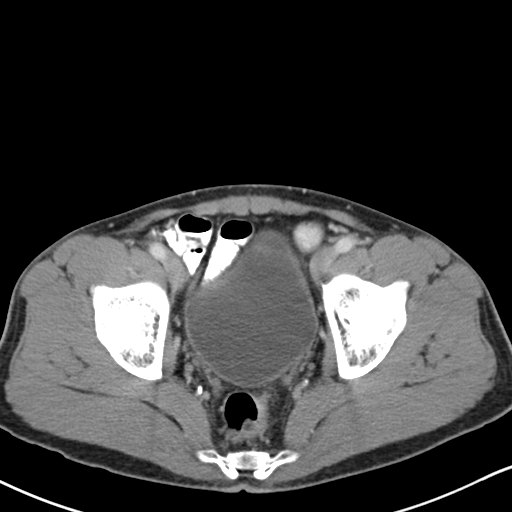
[im 26/91  soft-tissue]
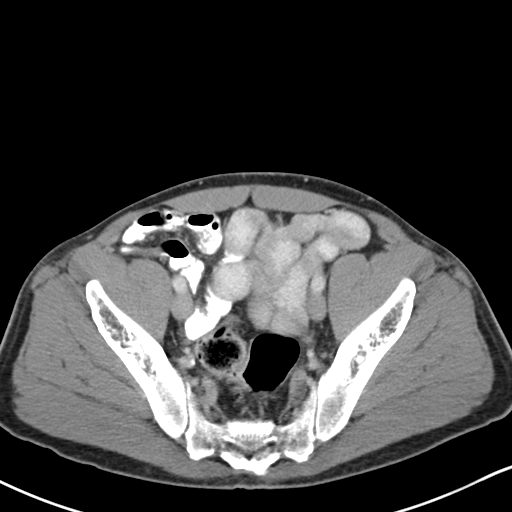
[im 31/91  soft-tissue]
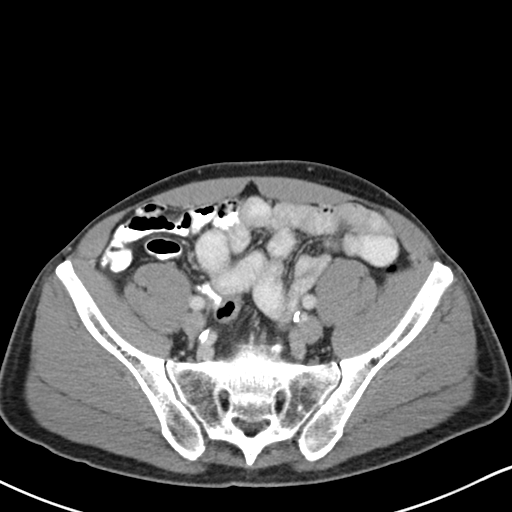
[im 41/91  soft-tissue]
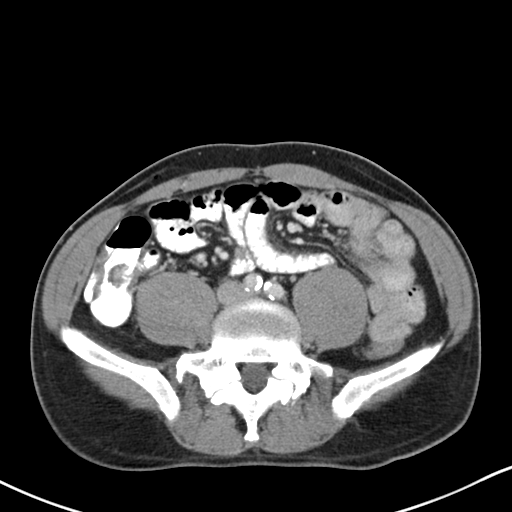
[im 46/91  soft-tissue]
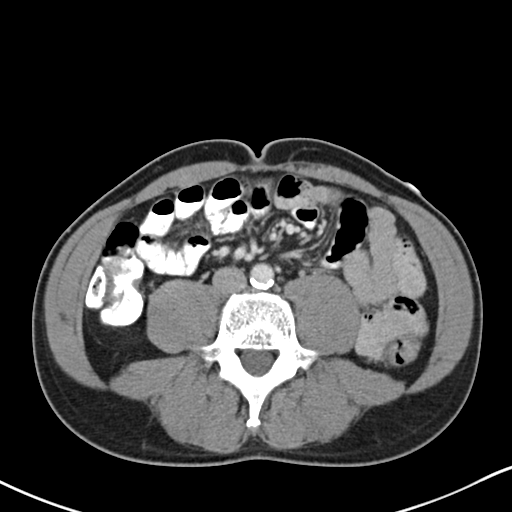
[im 51/91  soft-tissue]
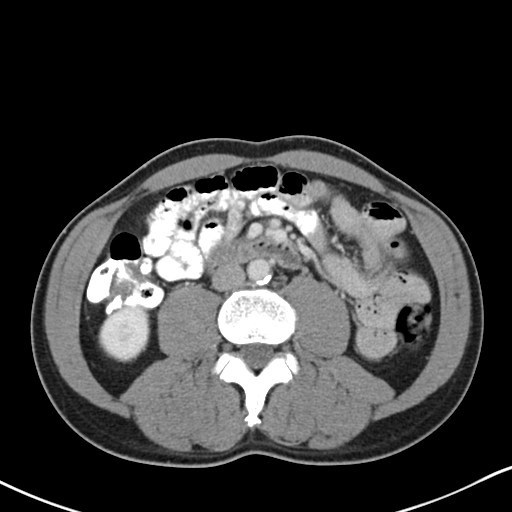
[im 61/91  soft-tissue]
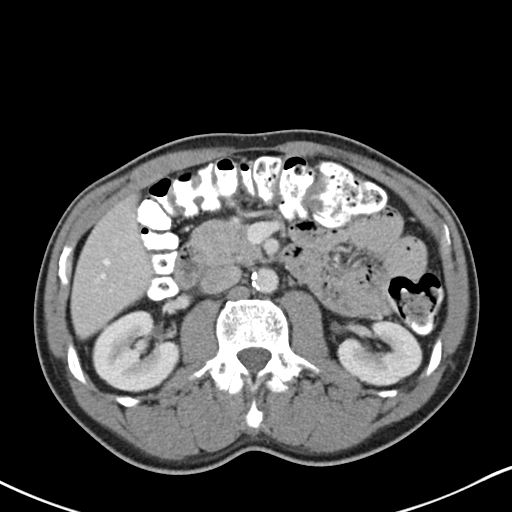
[im 61/91  bone]
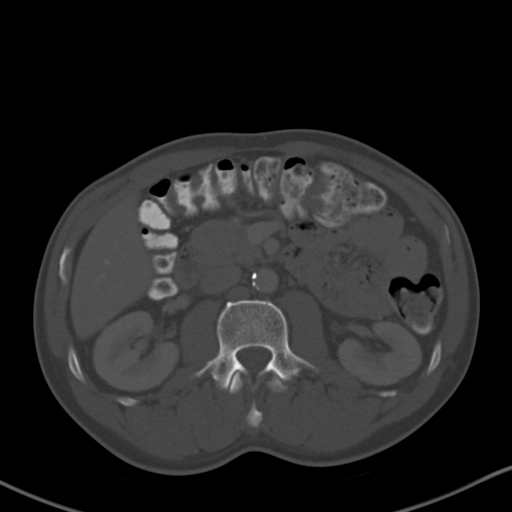
[im 66/91  soft-tissue]
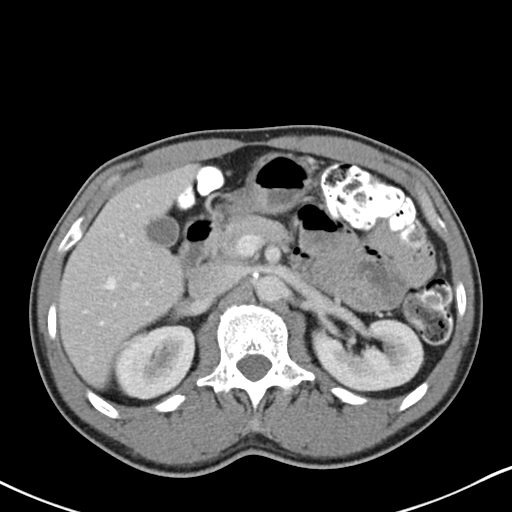
[im 71/91  soft-tissue]
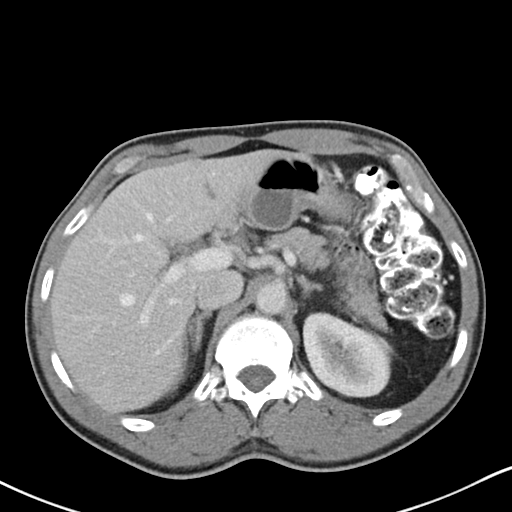
[im 81/91  soft-tissue]
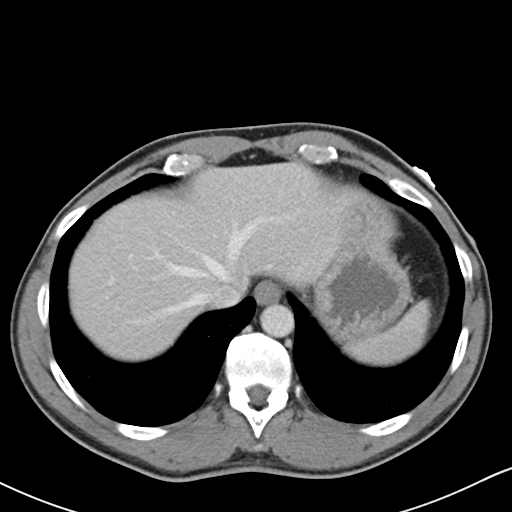
[im 86/91  soft-tissue]
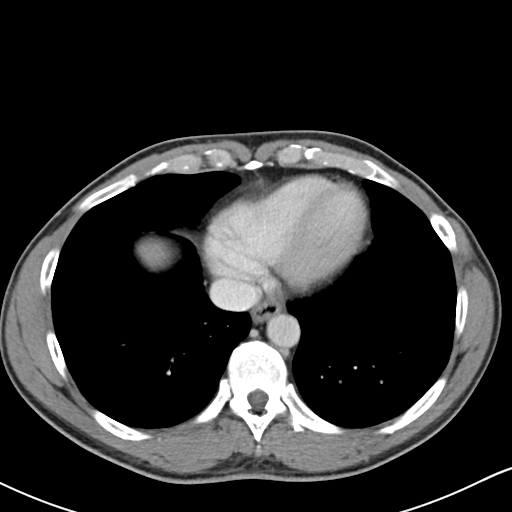

[Series 5: coronal soft tissue · coronal · 0.63mm/px · 3 of 78 slices shown]
[im 26/78  soft-tissue]
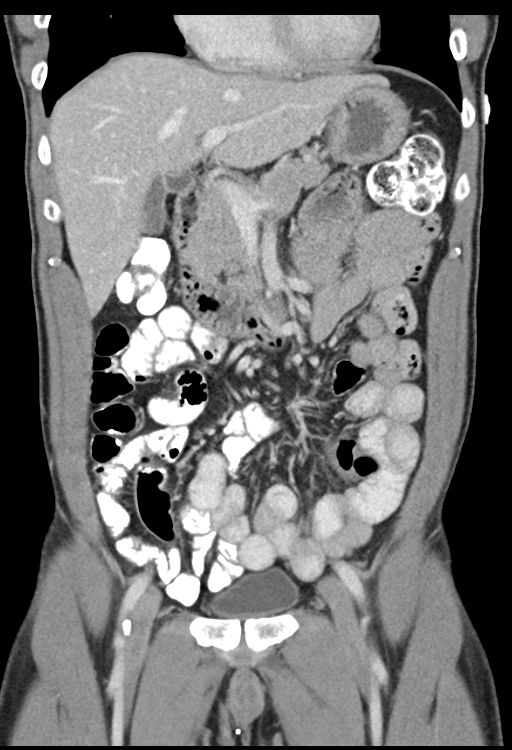
[im 35/78  soft-tissue]
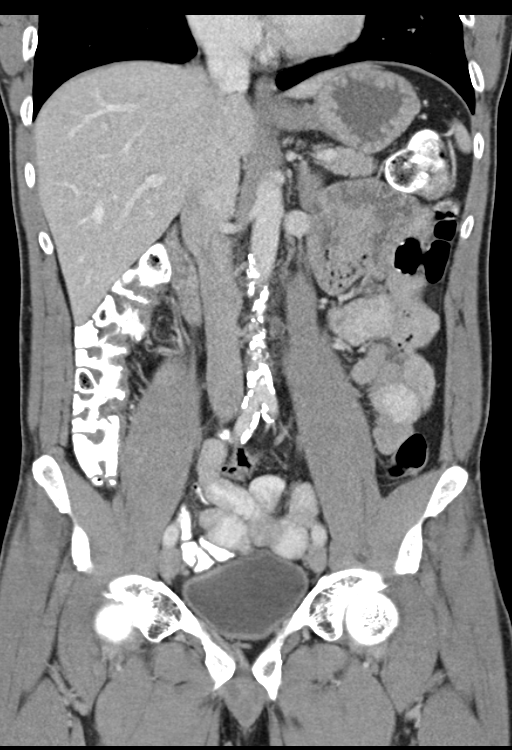
[im 43/78  soft-tissue]
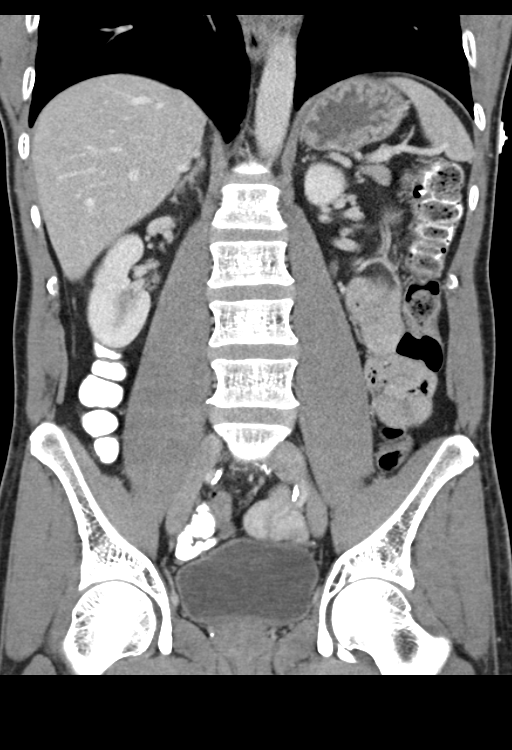

[16 of 46 positions shown; findings below may reference images not displayed]

FINDINGS: Lung bases:  Clear.  Heart normal size.

Hepatobiliary: Normal liver. Gallbladder is unremarkable. No bile
duct dilation.

Pancreas: Diffuse delayed areas of decreased attenuation in the
pancreatic head and uncinate process which may reflect mildly
dilated side ducts. No mass. No inflammation.

Spleen: Unremarkable.

Adrenal glands:  No masses.

Kidneys, ureters, bladder: Small cortical low-density lesion along
the posterior right kidney near the midpole, likely a cyst but too
small to fully characterize. No other liver lesions. No stones. No
hydronephrosis. Ureters are normal in course and caliber. Bladder is
unremarkable.

Vascular: There are atherosclerotic calcifications along the
abdominal aorta and iliac vessels. No aneurysm.

Lymph nodes:  No adenopathy.

Ascites: None.

Gastrointestinal:  Unremarkable.  Normal appendix visualized.

Musculoskeletal chronic bilateral pars defects at L5-S1 with a
slight anterolisthesis. Otherwise unremarkable.
IMPRESSION: 1. No acute findings within the abdomen pelvis. No evidence of
pancreatitis.
2. Chronic bilateral pars defects at L5-S1 with a minor grade 1
anterolisthesis.
3. Atherosclerosis along the abdominal aorta and iliac vessels.

## 2018-02-24 IMAGING — US US ABDOMEN LIMITED
1 series · 14 of 25 positions shown · non-contrast
Comparison: None.

CLINICAL DATA: Acute onset of right upper quadrant abdominal pain.
Initial encounter.

EXAM:
US ABDOMEN LIMITED - RIGHT UPPER QUADRANT

[Series 1: us abdomen limited · 0.17mm/px · 14 of 43 slices shown]
[im 1/43]
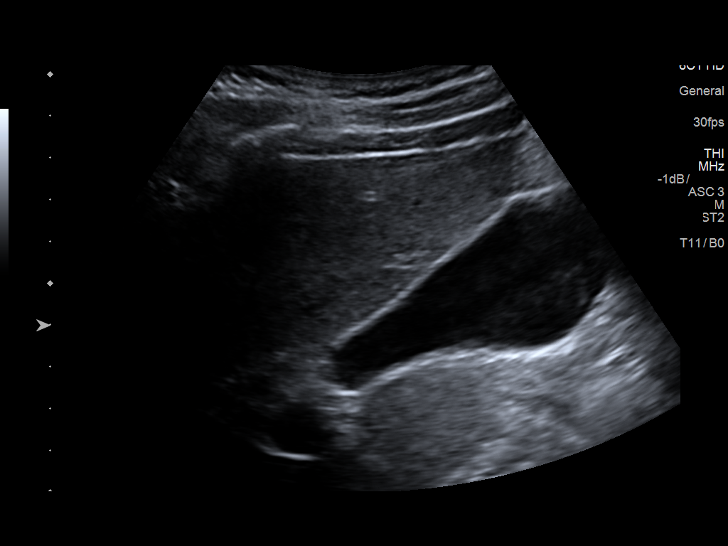
[im 4/43]
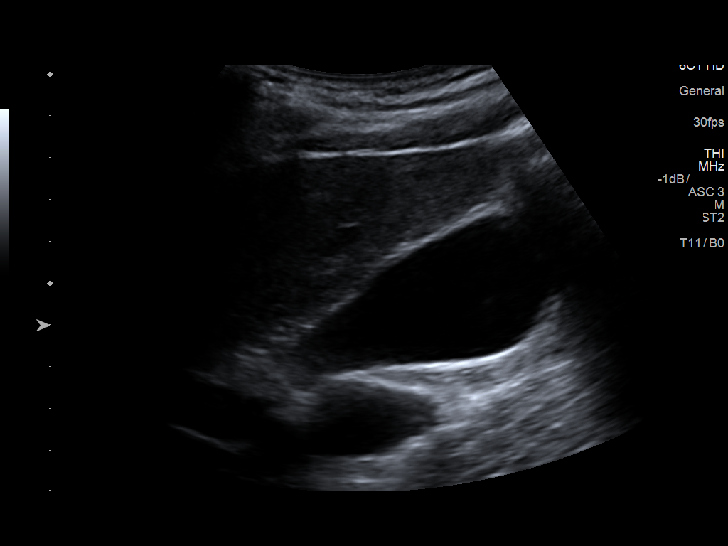
[im 8/43]
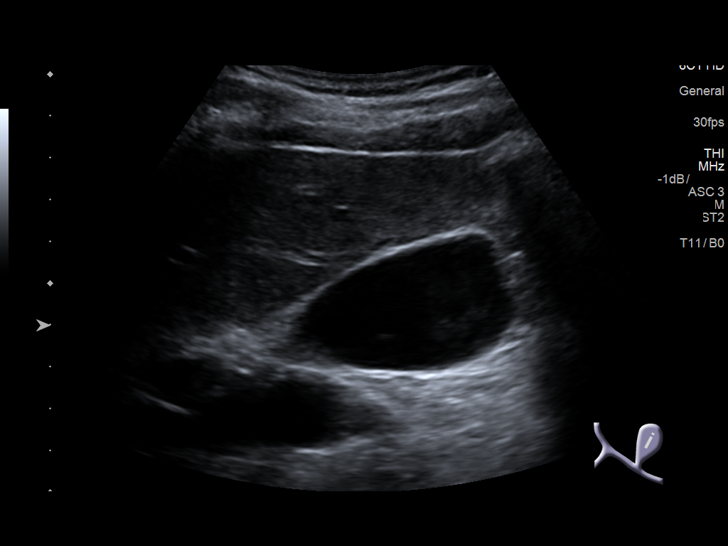
[im 11/43]
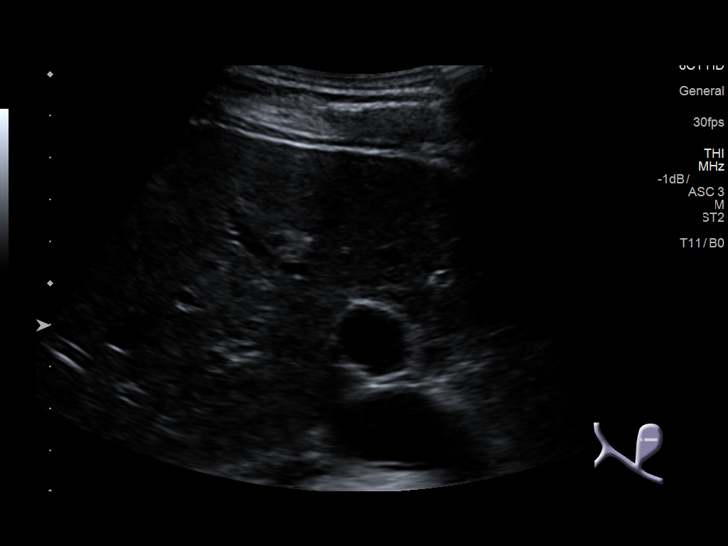
[im 15/43]
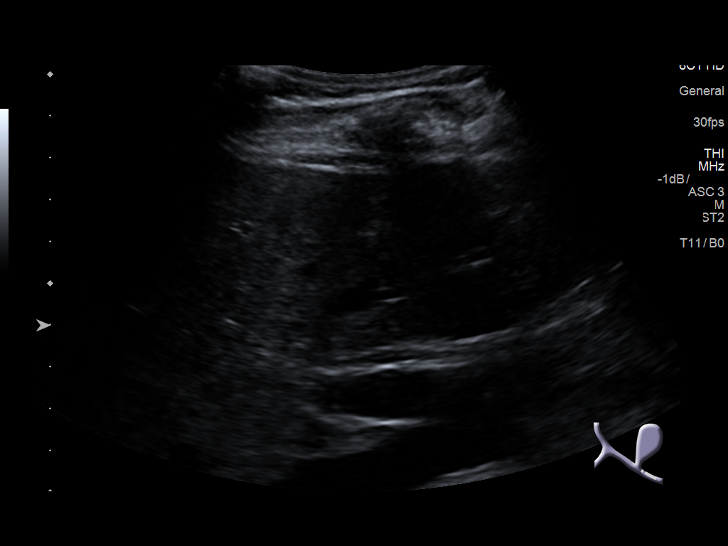
[im 16/43]
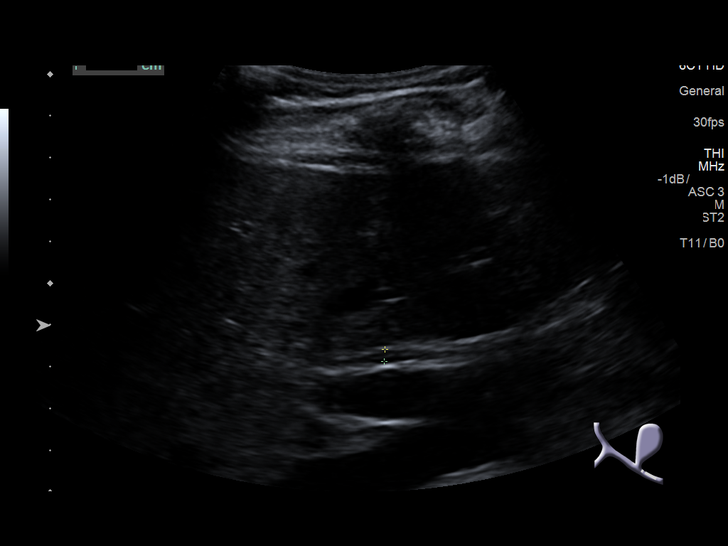
[im 20/43]
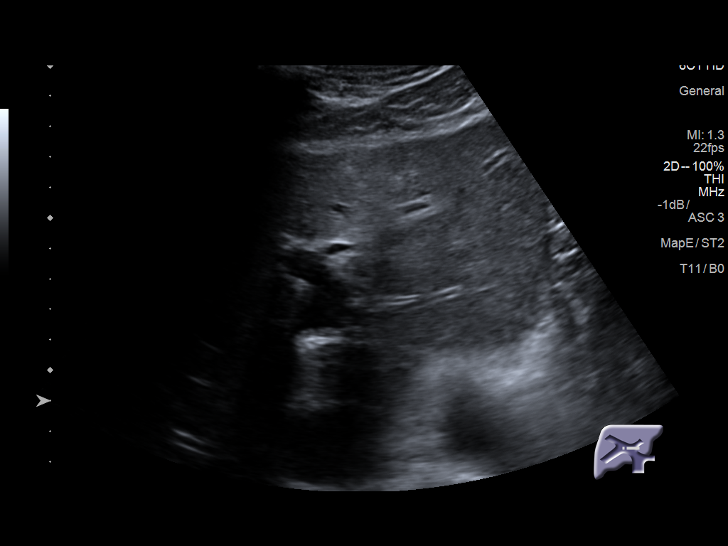
[im 23/43]
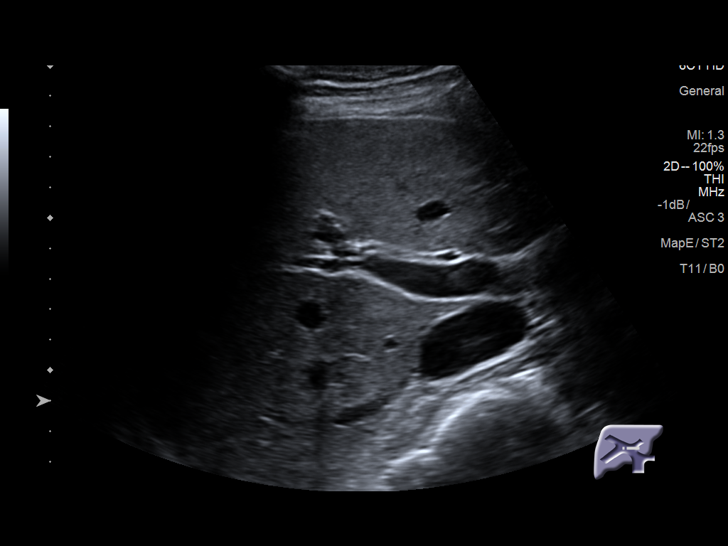
[im 27/43]
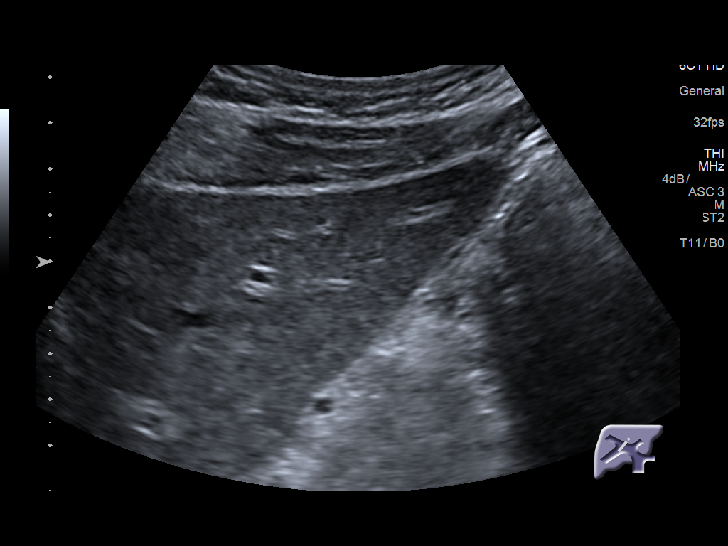
[im 29/43]
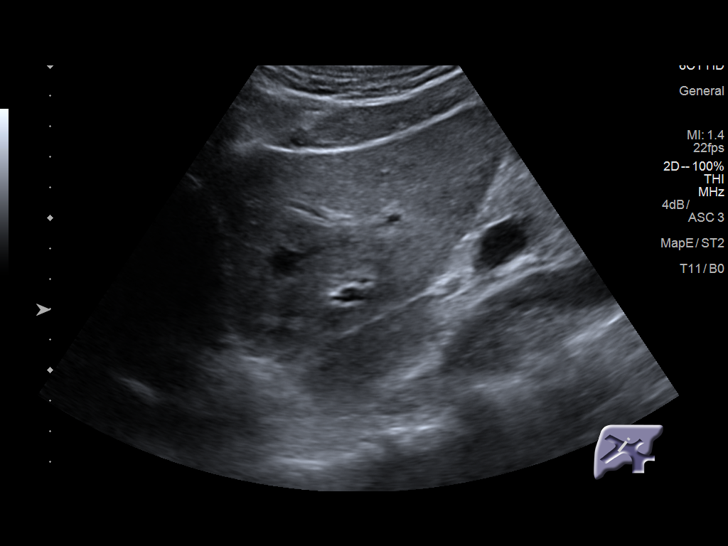
[im 32/43]
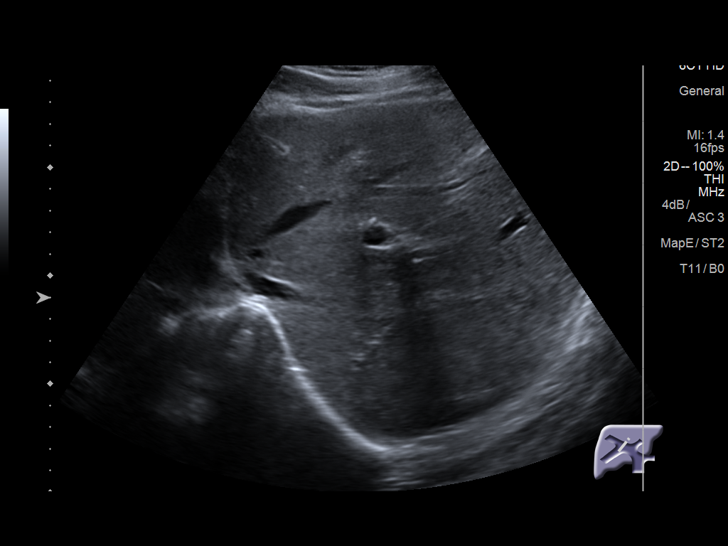
[im 36/43]
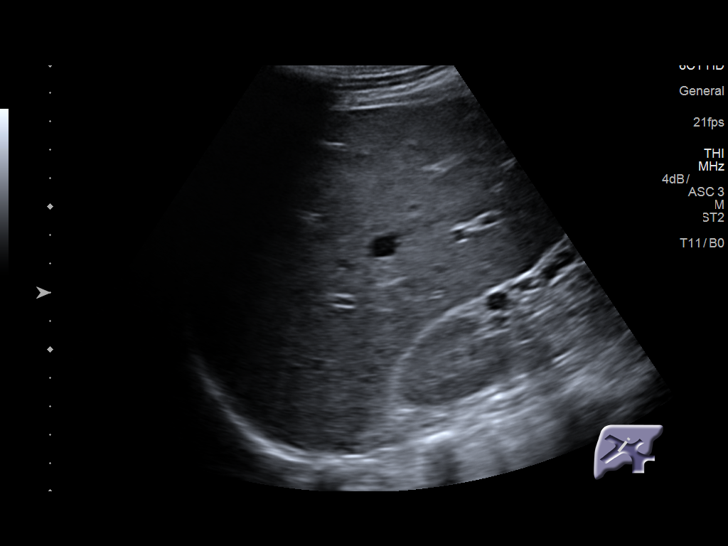
[im 39/43]
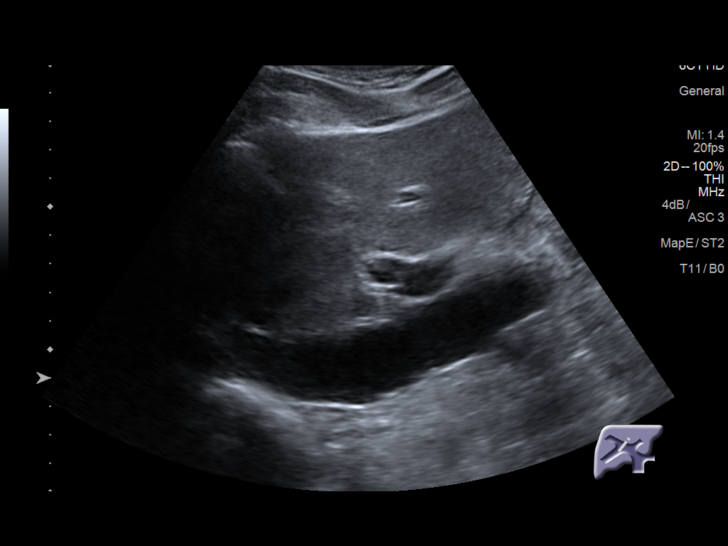
[im 43/43]
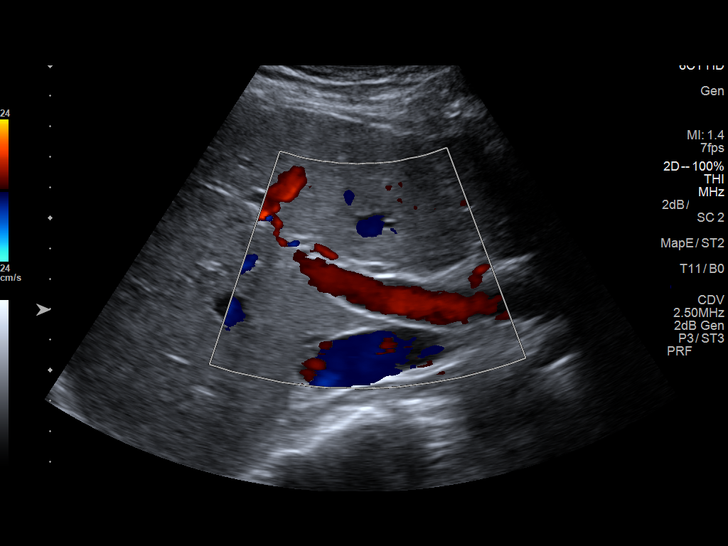

[14 of 25 positions shown; findings below may reference images not displayed]

FINDINGS: Gallbladder:

No gallstones or wall thickening visualized. Mild reverberation
artifact is noted within the gallbladder. No sonographic Murphy sign
noted by sonographer.

Common bile duct:

Diameter: 0.3 cm, within normal limits in caliber.

Liver:

No focal lesion identified. Within normal limits in parenchymal
echogenicity.
IMPRESSION: Unremarkable ultrasound of the right upper quadrant.

## 2019-01-20 IMAGING — US US ABDOMEN LIMITED
1 series · 14 of 25 positions shown · non-contrast
Comparison: None.

CLINICAL DATA: Abdominal pain and vomiting for several days

EXAM:
US ABDOMEN LIMITED - RIGHT UPPER QUADRANT

[Series 1: us abdomen limited · 0.19mm/px · 14 of 54 slices shown]
[im 1/54]
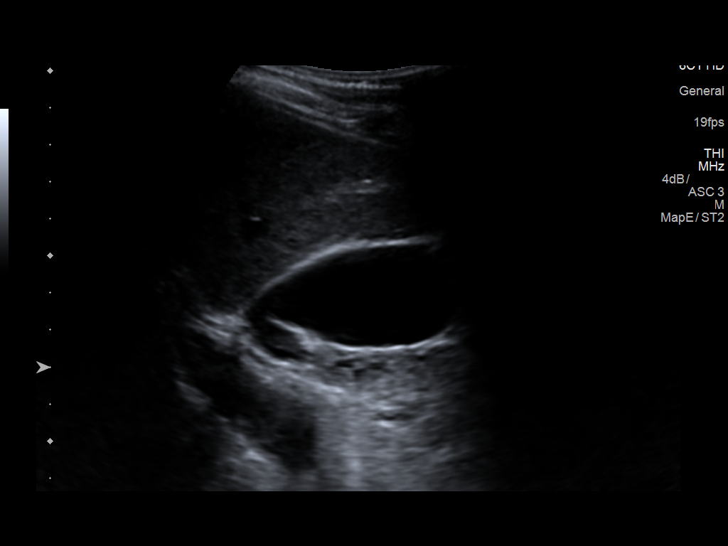
[im 5/54]
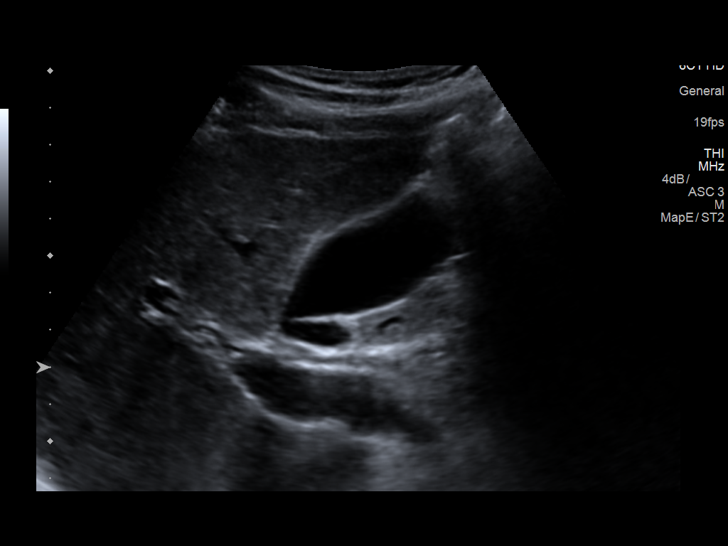
[im 9/54]
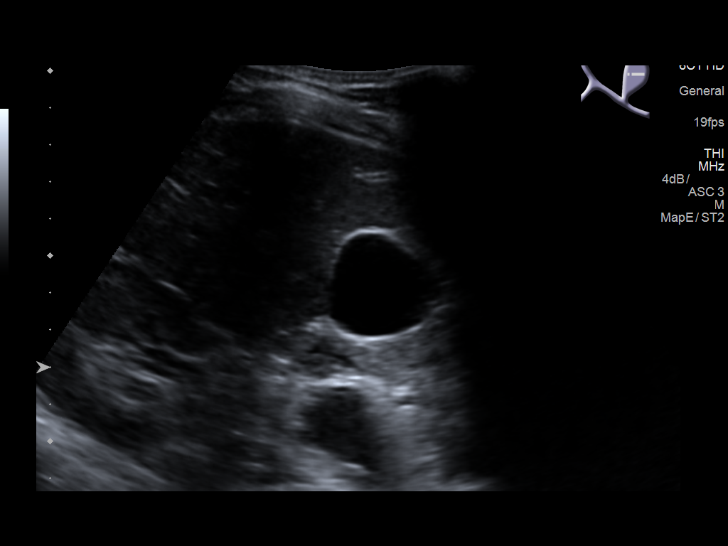
[im 14/54]
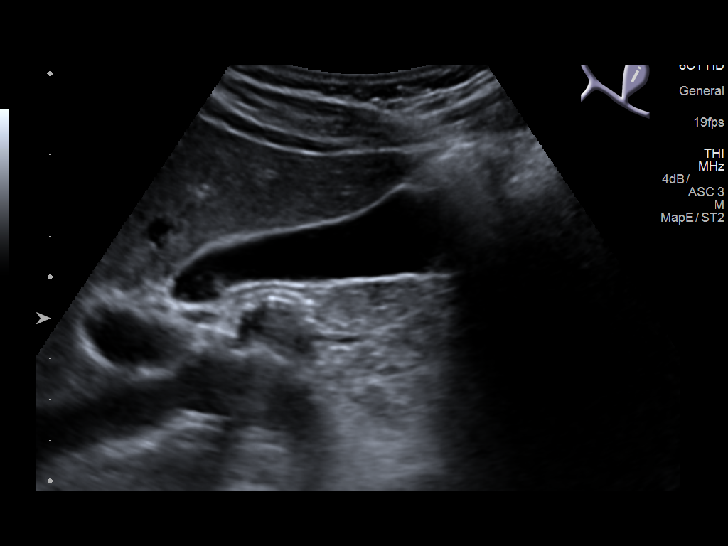
[im 18/54]
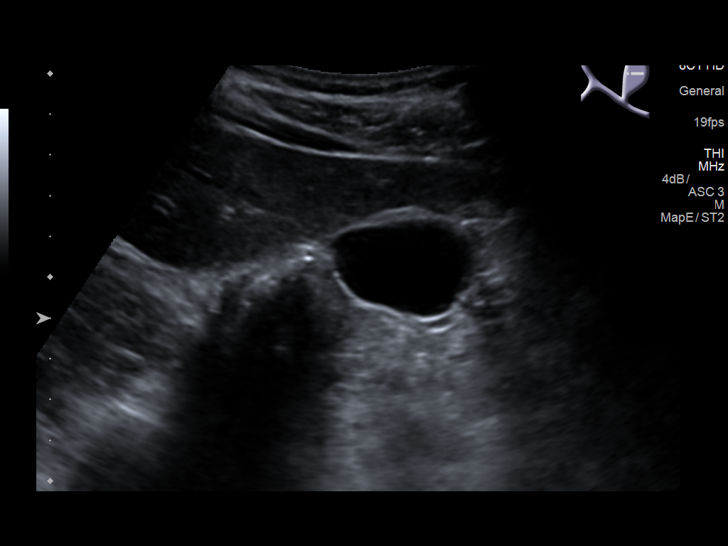
[im 20/54]
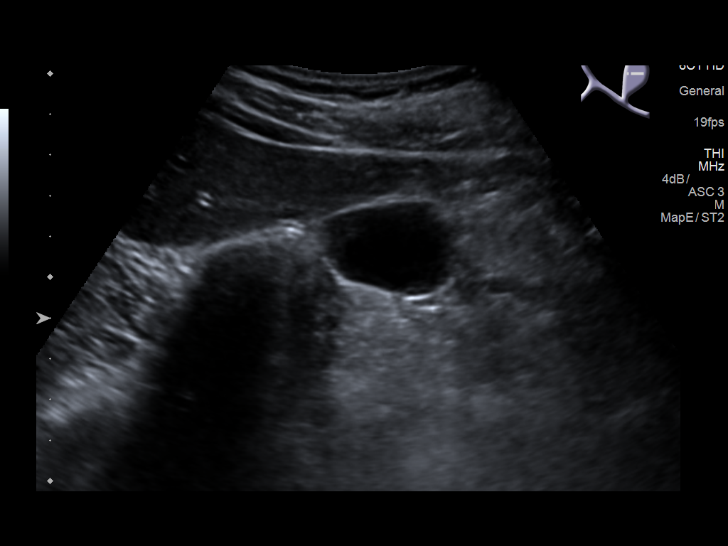
[im 25/54]
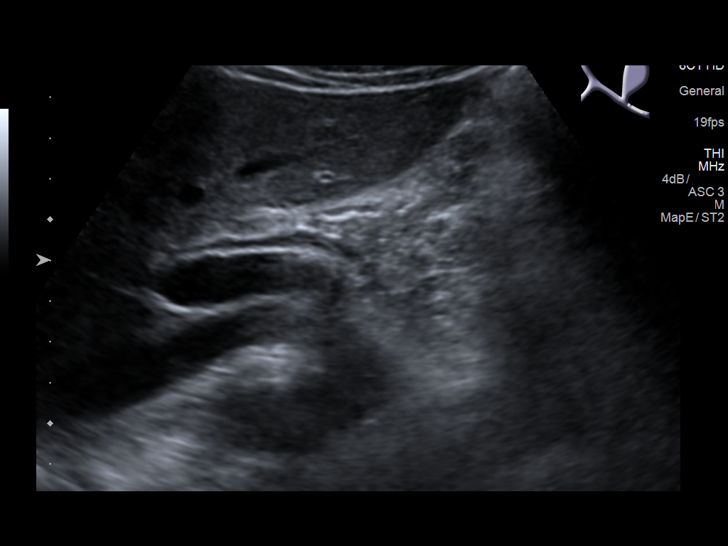
[im 29/54]
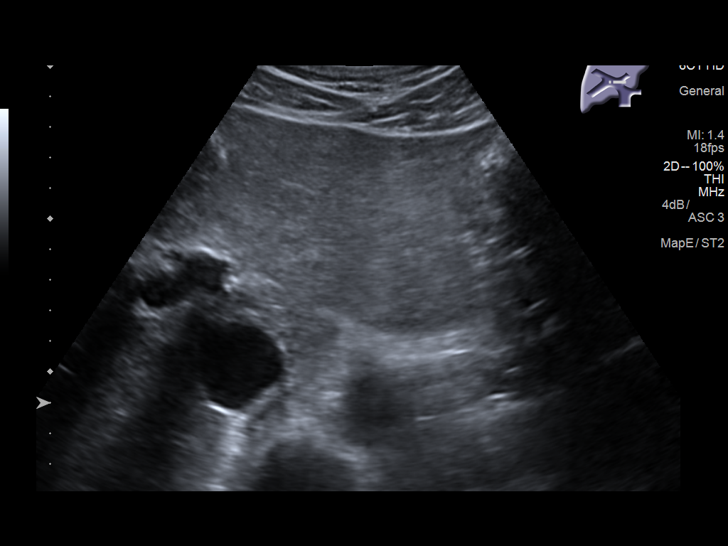
[im 34/54]
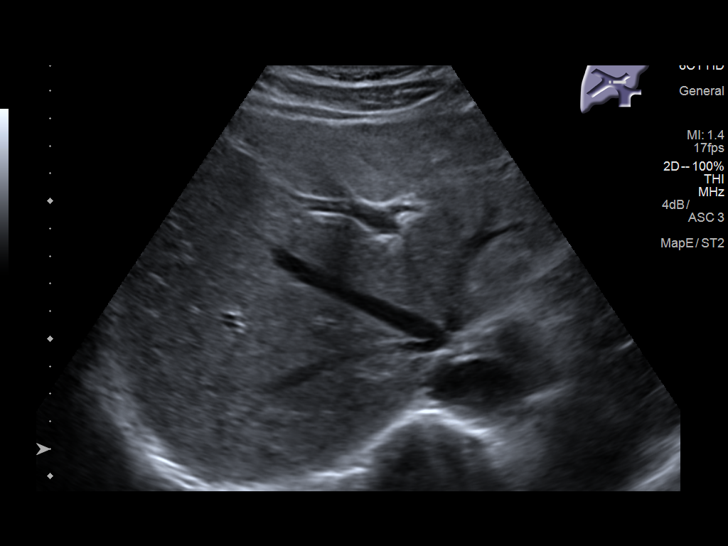
[im 36/54]
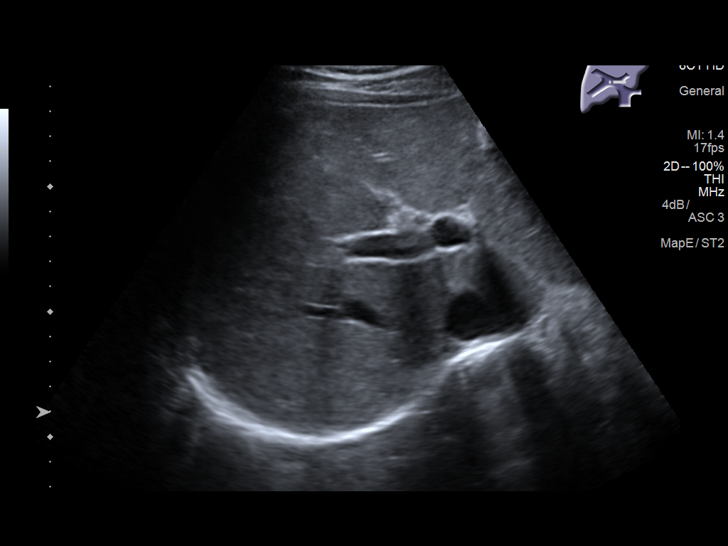
[im 40/54]
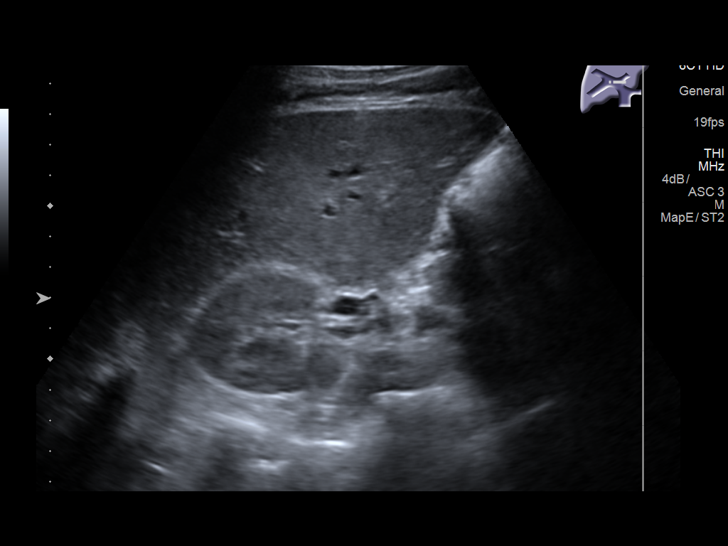
[im 45/54]
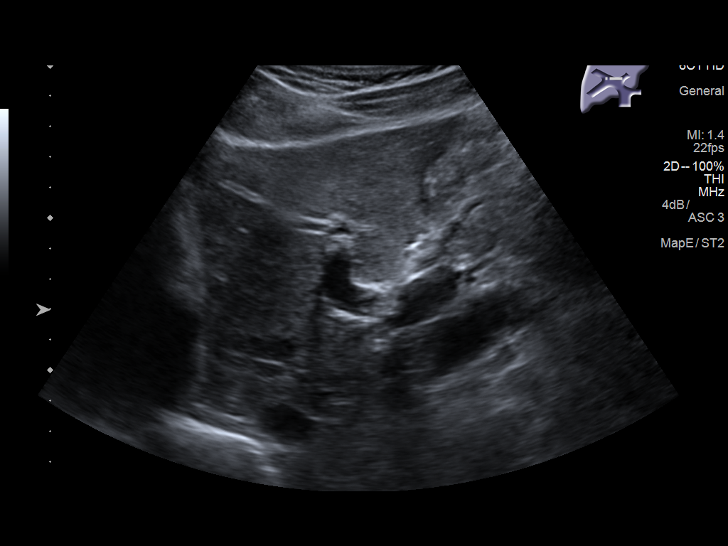
[im 49/54]
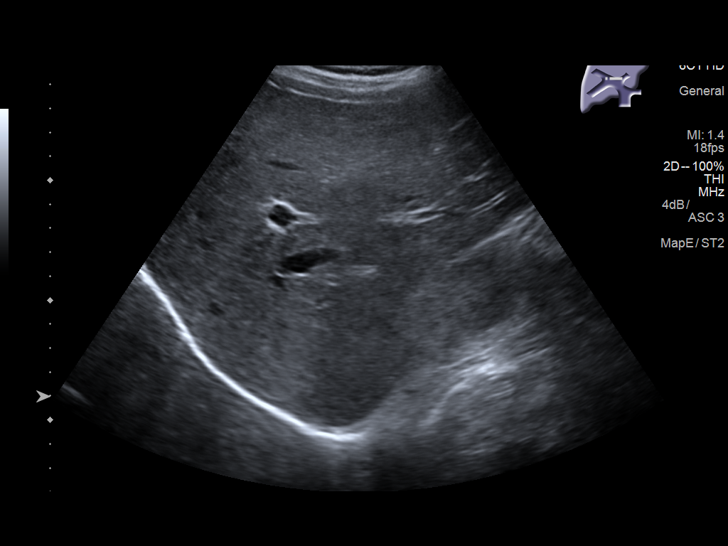
[im 54/54]
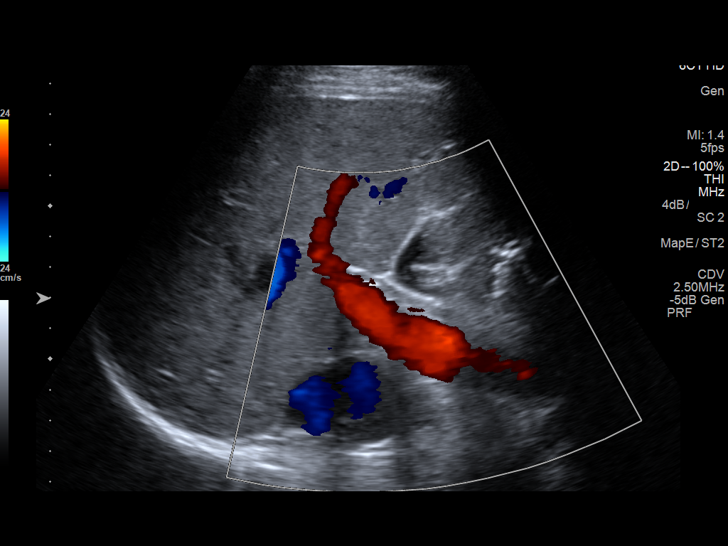

[14 of 25 positions shown; findings below may reference images not displayed]

FINDINGS: Gallbladder:

No gallstones or wall thickening visualized. No sonographic Murphy
sign noted by sonographer.

Common bile duct:

Diameter: 3.6 mm.

Liver:

No focal lesion identified. Within normal limits in parenchymal
echogenicity.
IMPRESSION: No acute abnormality noted.
